# Patient Record
Sex: Female | Born: 1995
Health system: Southern US, Community
[De-identification: ages and names within clinical notes are randomized; demographics above are authoritative.]

## PROBLEM LIST (undated history)

## (undated) DIAGNOSIS — F32A Depression, unspecified: Secondary | ICD-10-CM

## (undated) DIAGNOSIS — F329 Major depressive disorder, single episode, unspecified: Secondary | ICD-10-CM

## (undated) DIAGNOSIS — O210 Mild hyperemesis gravidarum: Secondary | ICD-10-CM

## (undated) DIAGNOSIS — F319 Bipolar disorder, unspecified: Secondary | ICD-10-CM

## (undated) DIAGNOSIS — F419 Anxiety disorder, unspecified: Secondary | ICD-10-CM

## (undated) HISTORY — PX: RHINOPLASTY: SUR1284

## (undated) HISTORY — DX: Mild hyperemesis gravidarum: O21.0

---
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
## 2015-04-09 DIAGNOSIS — M419 Scoliosis, unspecified: Secondary | ICD-10-CM | POA: Insufficient documentation

## 2018-08-26 DIAGNOSIS — Z0000 Encounter for general adult medical examination without abnormal findings: Secondary | ICD-10-CM | POA: Diagnosis not present

## 2018-08-26 DIAGNOSIS — Z30432 Encounter for removal of intrauterine contraceptive device: Secondary | ICD-10-CM | POA: Diagnosis not present

## 2019-02-01 DIAGNOSIS — F603 Borderline personality disorder: Secondary | ICD-10-CM | POA: Insufficient documentation

## 2019-02-01 DIAGNOSIS — F1521 Other stimulant dependence, in remission: Secondary | ICD-10-CM | POA: Insufficient documentation

## 2019-02-01 DIAGNOSIS — F4323 Adjustment disorder with mixed anxiety and depressed mood: Secondary | ICD-10-CM | POA: Insufficient documentation

## 2019-02-01 DIAGNOSIS — F332 Major depressive disorder, recurrent severe without psychotic features: Secondary | ICD-10-CM | POA: Insufficient documentation

## 2019-02-01 DIAGNOSIS — F1220 Cannabis dependence, uncomplicated: Secondary | ICD-10-CM | POA: Insufficient documentation

## 2019-02-01 DIAGNOSIS — F4310 Post-traumatic stress disorder, unspecified: Secondary | ICD-10-CM | POA: Insufficient documentation

## 2019-02-01 DIAGNOSIS — F902 Attention-deficit hyperactivity disorder, combined type: Secondary | ICD-10-CM | POA: Insufficient documentation

## 2019-02-03 DIAGNOSIS — F17200 Nicotine dependence, unspecified, uncomplicated: Secondary | ICD-10-CM | POA: Insufficient documentation

## 2019-05-25 ENCOUNTER — Other Ambulatory Visit: Payer: Self-pay

## 2019-05-25 ENCOUNTER — Encounter (HOSPITAL_COMMUNITY): Payer: Self-pay | Admitting: Emergency Medicine

## 2019-05-25 ENCOUNTER — Emergency Department (HOSPITAL_COMMUNITY)
Admission: EM | Admit: 2019-05-25 | Discharge: 2019-05-25 | Disposition: A | Discharge status: Home / Self Care | Payer: BC Managed Care – PPO | Attending: Emergency Medicine | Admitting: Emergency Medicine

## 2019-05-25 DIAGNOSIS — K649 Unspecified hemorrhoids: Secondary | ICD-10-CM | POA: Insufficient documentation

## 2019-05-25 DIAGNOSIS — F17210 Nicotine dependence, cigarettes, uncomplicated: Secondary | ICD-10-CM | POA: Insufficient documentation

## 2019-05-25 DIAGNOSIS — K6289 Other specified diseases of anus and rectum: Secondary | ICD-10-CM | POA: Diagnosis present

## 2019-05-25 DIAGNOSIS — K648 Other hemorrhoids: Secondary | ICD-10-CM

## 2019-05-25 HISTORY — DX: Anxiety disorder, unspecified: F41.9

## 2019-05-25 HISTORY — DX: Depression, unspecified: F32.A

## 2019-05-25 HISTORY — DX: Bipolar disorder, unspecified: F31.9

## 2019-05-25 MED ORDER — LIDOCAINE VISCOUS HCL 2 % MT SOLN
15.0000 mL | Freq: Once | OROMUCOSAL | Status: AC
  Administered 2019-05-25: 15 mL via OROMUCOSAL
  Filled 2019-05-25: qty 15

## 2019-05-25 MED ORDER — HYDROCORTISONE ACETATE 25 MG RE SUPP: 25.0000 mg | Freq: Two times a day (BID) | RECTAL | 0 refills | Status: DC

## 2019-05-25 NOTE — ED Provider Notes (Signed)
[facility] EMERGENCY DEPARTMENT Provider Note   CSN: [id number] Arrival date & time: 05/25/19  1412     History   Chief Complaint Chief Complaint  Patient presents with  . Rectal Pain    HPI Casey Wolfe is a 23 y.o. female presenting with pain and swelling at her rectum with a small amount of bright red blood noted which occurred with a bowel movement she had 2 days ago.  She denies history of prior symptoms, also denies constipation or straining to defecate.  She has had no treatment for this condition prior to arrival. Describes constant aching pain, worsened with walking/palpation of the site.     The history is provided by the patient.    Past Medical History:  Diagnosis Date  . Anxiety   . Bipolar 1 disorder (HCC)   . Depression     There are no active problems to display for this patient.   Past Surgical History:  Procedure Laterality Date  . RHINOPLASTY       OB History    Gravida      Para      Term      Preterm      AB      Living  0     SAB      TAB      Ectopic      Multiple      Live Births               Home Medications    Prior to Admission medications   Medication Sig Start Date End Date Taking? Authorizing Provider  hydrocortisone (ANUSOL-HC) 25 MG suppository Place 1 suppository (25 mg total) rectally 2 (two) times daily. 05/25/19   Burgess Amor, PA-C    Family History Family History  Problem Relation Age of Onset  . COPD Other   . Congestive Heart Failure Other   . Diabetes Other     Social History Social History   Tobacco Use  . Smoking status: Current Every Day Smoker    Packs/day: 0.50    Types: Cigarettes  . Smokeless tobacco: Never Used  Substance Use Topics  . Alcohol use: Yes    Comment: 2-3 x week  . Drug use: Yes    Types: Marijuana    Comment: this morning     Allergies   Patient has no known allergies.   Review of Systems Review of Systems  Constitutional: Negative for fever.  HENT:  Negative for congestion.   Eyes: Negative.   Respiratory: Negative for chest tightness and shortness of breath.   Cardiovascular: Negative for chest pain.  Gastrointestinal: Positive for anal bleeding and rectal pain. Negative for abdominal pain, diarrhea, nausea and vomiting.  Genitourinary: Negative.   Musculoskeletal: Negative for arthralgias, joint swelling and neck pain.  Skin: Negative.  Negative for rash and wound.  Neurological: Negative for dizziness, weakness, light-headedness, numbness and headaches.  Psychiatric/Behavioral: Negative.      Physical Exam Updated Vital Signs BP 135/82 (BP Location: Right Arm)   Pulse 69   Temp 98.6 F (37 C) (Oral)   Resp 16   Ht 5\' 4"  (1.626 m)   Wt 87.1 kg   LMP 05/23/2019 (Exact Date)   SpO2 98%   BMI 32.96 kg/m   Physical Exam Vitals signs and nursing note reviewed. Exam conducted with a chaperone present.  Constitutional:      Appearance: She is well-developed.  HENT:     Head: Normocephalic and  atraumatic.  Eyes:     Conjunctiva/sclera: Conjunctivae normal.  Neck:     Musculoskeletal: Normal range of motion.  Cardiovascular:     Rate and Rhythm: Normal rate.  Pulmonary:     Effort: Pulmonary effort is normal.     Breath sounds: No wheezing.  Abdominal:     General: Bowel sounds are normal. There is no distension.     Palpations: Abdomen is soft. There is no mass.     Tenderness: There is no abdominal tenderness.  Genitourinary:    Rectum: External hemorrhoid present.     Comments: Small external hemorrhoid, soft, not thrombosed. Musculoskeletal: Normal range of motion.  Skin:    General: Skin is warm and dry.  Neurological:     Mental Status: She is alert.      ED Treatments / Results  Labs (all labs ordered are listed, but only abnormal results are displayed) Labs Reviewed - No data to display  EKG None  Radiology No results found.  Procedures Procedures (including critical care time)   Medications Ordered in ED Medications  lidocaine (XYLOCAINE) 2 % viscous mouth solution 15 mL (15 mLs Mouth/Throat Given 05/25/19 1649)     Initial Impression / Assessment and Plan / ED Course  I have reviewed the triage vital signs and the nursing notes.  Pertinent labs & imaging results that were available during my care of the patient were reviewed by me and considered in my medical decision making (see chart for details).        External hemorrhoid, discussed home tx including warm sitz baths, hydrocortisone rectal suppository prescribed, avoid constipation (pt denies).  Return precautions discussed.   Final Clinical Impressions(s) / ED Diagnoses   Final diagnoses:  Other hemorrhoids    ED Discharge Orders         Ordered    hydrocortisone (ANUSOL-HC) 25 MG suppository  2 times daily     05/25/19 1648           Landis Martins 05/25/19 1728    Davonna Belling, MD 05/25/19 2332

## 2019-05-25 NOTE — Discharge Instructions (Addendum)
Sit in a warm tub for 10-15 minutes, twice daily if possible,  followed by application of the medicine prescribed.  If you are unable to get the medicine filled, you may try Preparation H which is an over the counter version of this medicine.  Applying gentle pressure should also help to gradually reduce this hemorrhoid until it is gone.  Get rechecked for any increased pain or swelling.  Apply the lidocaine every 4 hours if needed for pain relief.

## 2019-05-25 NOTE — ED Triage Notes (Addendum)
EMS reports pt has rectal prolapse.  Reports small amount of bright red blood.  Pt says it started prolapsing Monday but has gotten worse.    EMS says pt drank 1 "bootlegger" pta.  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  EMS reports pt refused to wear a mask due to PTSD.

## 2019-05-25 NOTE — ED Triage Notes (Signed)
Patient reports a rectal prolapse, felt on Monday. Reports she has had a BM.

## 2019-07-29 NOTE — L&D Delivery Note (Addendum)
Delivery Note At 7:27 PM a viable female was delivered via Vaginal, Spontaneous (Presentation:   Occiput Anterior).  APGAR: 3, 8. NICU team called due to prolonged cyanosis, no good cry. Baby back to mom for skin-to-skin once more appropriate and cleared by NICU team; weight: pending   Placenta status: Spontaneous, Intact.  Cord: 3 vessels with the following complications: None.    Anesthesia: Epidural Episiotomy: None Lacerations: 2nd degree Suture Repair: 3.0 vicryl Est. Blood Loss (mL):  100  Mom to postpartum.  Baby to Couplet care / Skin to Skin.  Sabino Dick 03/18/2020, 8:00 PM  Patient is a 24 y.o. at [redacted]w[redacted]d who was admitted for IOL for gHTN on 8/20, significant hx of anxiety and depression.  She progressed with augmentation via FB, cytotec, AROM and pitocin.  I was gloved and present for delivery in its entirety.  Second stage of labor progressed, baby delivered after 3 hours of pushing. Infant intially with spontaneous cry and tone. At 2 minutes of life, infant cyanotic and poor effort noted. Code APGAR called and NICU to assess baby.    Complications: none  Lacerations: 2nd degree laceration  EBL: 100  Rolm Bookbinder, CNM 8:05 PM

## 2019-08-10 DIAGNOSIS — R1030 Lower abdominal pain, unspecified: Secondary | ICD-10-CM | POA: Diagnosis not present

## 2019-08-10 DIAGNOSIS — O26891 Other specified pregnancy related conditions, first trimester: Secondary | ICD-10-CM | POA: Diagnosis not present

## 2019-08-10 DIAGNOSIS — Z3A01 Less than 8 weeks gestation of pregnancy: Secondary | ICD-10-CM | POA: Diagnosis not present

## 2019-08-10 DIAGNOSIS — Z87891 Personal history of nicotine dependence: Secondary | ICD-10-CM | POA: Diagnosis not present

## 2019-08-10 DIAGNOSIS — Z79899 Other long term (current) drug therapy: Secondary | ICD-10-CM | POA: Diagnosis not present

## 2019-08-17 DIAGNOSIS — Z3490 Encounter for supervision of normal pregnancy, unspecified, unspecified trimester: Secondary | ICD-10-CM | POA: Diagnosis not present

## 2019-08-19 LAB — OB RESULTS CONSOLE HEPATITIS B SURFACE ANTIGEN: Hepatitis B Surface Ag: NEGATIVE

## 2019-08-19 LAB — HIV ANTIBODY (ROUTINE TESTING W REFLEX): HIV Screen 4th Generation wRfx: NONREACTIVE

## 2019-08-19 LAB — OB RESULTS CONSOLE RPR: RPR: NONREACTIVE

## 2019-08-19 LAB — OB RESULTS CONSOLE RUBELLA ANTIBODY, IGM: Rubella: IMMUNE

## 2019-08-30 DIAGNOSIS — R112 Nausea with vomiting, unspecified: Secondary | ICD-10-CM | POA: Diagnosis not present

## 2019-08-31 DIAGNOSIS — Z3A09 9 weeks gestation of pregnancy: Secondary | ICD-10-CM | POA: Diagnosis not present

## 2019-08-31 DIAGNOSIS — O211 Hyperemesis gravidarum with metabolic disturbance: Secondary | ICD-10-CM | POA: Diagnosis not present

## 2019-09-01 DIAGNOSIS — O211 Hyperemesis gravidarum with metabolic disturbance: Secondary | ICD-10-CM | POA: Diagnosis not present

## 2019-09-01 DIAGNOSIS — Z3A09 9 weeks gestation of pregnancy: Secondary | ICD-10-CM | POA: Diagnosis not present

## 2019-09-06 DIAGNOSIS — Z3490 Encounter for supervision of normal pregnancy, unspecified, unspecified trimester: Secondary | ICD-10-CM | POA: Diagnosis not present

## 2019-09-06 DIAGNOSIS — R112 Nausea with vomiting, unspecified: Secondary | ICD-10-CM | POA: Diagnosis not present

## 2019-09-06 DIAGNOSIS — Z3A11 11 weeks gestation of pregnancy: Secondary | ICD-10-CM | POA: Diagnosis not present

## 2019-09-08 DIAGNOSIS — R112 Nausea with vomiting, unspecified: Secondary | ICD-10-CM | POA: Diagnosis not present

## 2019-09-10 DIAGNOSIS — R112 Nausea with vomiting, unspecified: Secondary | ICD-10-CM | POA: Diagnosis not present

## 2019-09-13 DIAGNOSIS — R112 Nausea with vomiting, unspecified: Secondary | ICD-10-CM | POA: Diagnosis not present

## 2019-09-16 DIAGNOSIS — R112 Nausea with vomiting, unspecified: Secondary | ICD-10-CM | POA: Diagnosis not present

## 2019-09-20 DIAGNOSIS — Z3490 Encounter for supervision of normal pregnancy, unspecified, unspecified trimester: Secondary | ICD-10-CM | POA: Diagnosis not present

## 2019-09-20 DIAGNOSIS — R112 Nausea with vomiting, unspecified: Secondary | ICD-10-CM | POA: Diagnosis not present

## 2019-09-20 DIAGNOSIS — Z3A11 11 weeks gestation of pregnancy: Secondary | ICD-10-CM | POA: Diagnosis not present

## 2019-09-22 DIAGNOSIS — R112 Nausea with vomiting, unspecified: Secondary | ICD-10-CM | POA: Diagnosis not present

## 2019-09-26 DIAGNOSIS — R112 Nausea with vomiting, unspecified: Secondary | ICD-10-CM | POA: Diagnosis not present

## 2019-09-28 DIAGNOSIS — R112 Nausea with vomiting, unspecified: Secondary | ICD-10-CM | POA: Diagnosis not present

## 2019-10-01 DIAGNOSIS — R112 Nausea with vomiting, unspecified: Secondary | ICD-10-CM | POA: Diagnosis not present

## 2019-10-03 DIAGNOSIS — R112 Nausea with vomiting, unspecified: Secondary | ICD-10-CM | POA: Diagnosis not present

## 2019-10-18 ENCOUNTER — Encounter: Payer: Self-pay | Admitting: Advanced Practice Midwife

## 2019-10-18 ENCOUNTER — Other Ambulatory Visit: Payer: Self-pay

## 2019-10-18 ENCOUNTER — Ambulatory Visit (INDEPENDENT_AMBULATORY_CARE_PROVIDER_SITE_OTHER): Payer: BC Managed Care – PPO | Admitting: Advanced Practice Midwife

## 2019-10-18 VITALS — BP 102/63 | HR 86 | Temp 98.4°F | Ht 63.0 in | Wt 216.0 lb

## 2019-10-18 DIAGNOSIS — O99342 Other mental disorders complicating pregnancy, second trimester: Secondary | ICD-10-CM

## 2019-10-18 DIAGNOSIS — F329 Major depressive disorder, single episode, unspecified: Secondary | ICD-10-CM

## 2019-10-18 DIAGNOSIS — F32A Depression, unspecified: Secondary | ICD-10-CM

## 2019-10-18 DIAGNOSIS — O99213 Obesity complicating pregnancy, third trimester: Secondary | ICD-10-CM | POA: Insufficient documentation

## 2019-10-18 DIAGNOSIS — O210 Mild hyperemesis gravidarum: Secondary | ICD-10-CM

## 2019-10-18 DIAGNOSIS — Z3A19 19 weeks gestation of pregnancy: Secondary | ICD-10-CM

## 2019-10-18 DIAGNOSIS — Z3480 Encounter for supervision of other normal pregnancy, unspecified trimester: Secondary | ICD-10-CM

## 2019-10-18 DIAGNOSIS — O99212 Obesity complicating pregnancy, second trimester: Secondary | ICD-10-CM

## 2019-10-18 DIAGNOSIS — Z3482 Encounter for supervision of other normal pregnancy, second trimester: Secondary | ICD-10-CM

## 2019-10-18 MED ORDER — ASPIRIN EC 81 MG PO TBEC: 81.0000 mg | DELAYED_RELEASE_TABLET | Freq: Every day | ORAL | 5 refills | Status: DC

## 2019-10-18 NOTE — Addendum Note (Signed)
Addended by: Sharen Counter A on: 10/18/2019 06:05 PM   Modules accepted: Orders

## 2019-10-18 NOTE — Progress Notes (Signed)
.     Subjective:   Casey Wolfe is a 24 y.o. G3P0020 at [redacted]w[redacted]d by LMP being seen today for her first obstetrical visit.  Her obstetrical history is significant for obesity and has Supervision of other normal pregnancy, antepartum; Methamphetamine use disorder, severe, in sustained remission (HCC); MDD (major depressive disorder), recurrent severe, without psychosis (HCC); Borderline personality disorder (HCC); Adjustment disorder with mixed anxiety and depressed mood; ADHD (attention deficit hyperactivity disorder), combined type; Moderate cannabis use disorder (HCC); PTSD (post-traumatic stress disorder); Scoliosis; and Tobacco use disorder on their problem list.. Patient does intend to breast feed. Pregnancy history fully reviewed.  Patient reports feeling nervous about her pregnancy, worried she will not bond with the baby. Her boyfriend passed in August and the father of her current pregnancy she says is not her boyfriend that passed. Patient is not currently working due to dizziness and nausea and vomiting. States she constantly feels light-headed so it is hard for her to do simple tasks. States she has had IV fluids at home from infusion center in [facility], states the dizziness was improving. States her vomiting is worse at night.    WANTS TO TELL MOM GENDER NOT HER SO THEY CAN HAVE GENDER REVEAL.  HISTORY: OB History  Gravida Para Term Preterm AB Living  3 0 0 0 2 0  SAB TAB Ectopic Multiple Live Births  2 0 0 0 0    # Outcome Date GA Lbr Len/2nd Weight Sex Delivery Anes PTL Lv  3 Current           2 SAB 2015          1 SAB 2013           Past Medical History:  Diagnosis Date  . Anxiety   . Bipolar 1 disorder (HCC)   . Depression   . Hyperemesis affecting pregnancy, antepartum    Past Surgical History:  Procedure Laterality Date  . RHINOPLASTY     Family History  Problem Relation Age of Onset  . COPD Other   . Congestive Heart Failure Other   . Diabetes Other     . Anxiety disorder Mother   . Arthritis Mother   . Heart murmur Sister   . ADD / ADHD Brother   . Heart murmur Brother   . Diabetes Maternal Grandmother   . Hyperlipidemia Maternal Grandmother   . Hypertension Maternal Grandmother   . Aneurysm Maternal Grandmother    Social History   Tobacco Use  . Smoking status: Former Smoker    Packs/day: 0.50    Types: Cigarettes    Quit date: 08/18/2019    Years since quitting: 0.1  . Smokeless tobacco: Never Used  Substance Use Topics  . Alcohol use: Not Currently    Comment: 2-3 x week  . Drug use: Yes    Types: Marijuana    Comment: this morning   No Known Allergies Current Outpatient Medications on File Prior to Visit  Medication Sig Dispense Refill  . buPROPion (WELLBUTRIN XL) 300 MG 24 hr tablet Take 300 mg by mouth daily.    . Doxylamine-Pyridoxine (DICLEGIS) 10-10 MG TBEC Diclegis 10 mg-10 mg tablet,delayed release  2 po at hs, then 1 po in am and in afternoon.    . famotidine (PEPCID) 20 MG tablet Take by mouth.    . folic acid (FOLVITE) 800 MCG tablet Take 400 mcg by mouth daily.    Marland Kitchen lamoTRIgine (LAMICTAL) 100 MG tablet Take by mouth.    Marland Kitchen  meclizine (ANTIVERT) 12.5 MG tablet meclizine 12.5 mg tablet  TAKE ONE TABLET (12.5 MG DOSE) BY MOUTH 3 TIMES DAILY FOR 10 DAYS    . ondansetron (ZOFRAN ODT) 4 MG disintegrating tablet Zofran ODT 4 mg disintegrating tablet  Place 2 tablets twice a day by translingual route.    . Prenatal Vit-DSS-Fe Fum-FA (PRENATAL 19) tablet Take by mouth.    . promethazine (PHENERGAN) 25 MG tablet Take by mouth.    . sertraline (ZOLOFT) 100 MG tablet Take 150 mg by mouth daily.    . hydrocortisone (ANUSOL-HC) 25 MG suppository Place 1 suppository (25 mg total) rectally 2 (two) times daily. (Patient not taking: Reported on 10/18/2019) 12 suppository 0   No current facility-administered medications on file prior to visit.     Indications for ASA therapy (per uptodate) One of the following: Multifetal  gestation No Chronic hypertension No Type 1 or 2 diabetes mellitus No Chronic kidney disease No Autoimmune disease (antiphospholipid syndrome, systemic lupus erythematosus) No   Two or more of the following: Nulliparity yes Obesity (body mass index >30 kg/m2) yes  Family history of preeclampsia in mother or sister No Age ?35 years No Sociodemographic characteristics (African American race, low socioeconomic level) Yes  Personal risk factors (eg, previous pregnancy with low birth weight or small for gestational age infant, previous adverse pregnancy outcome [eg, stillbirth], interval >10 years between pregnancies) No   Indications for early 1 hour GTT (per uptodate)  BMI >25 (>23 in Asian women) AND one of the following  Gestational diabetes mellitus in a previous pregnancy No Glycated hemoglobin ?5.7 percent (39 mmol/mol), impaired glucose tolerance, or impaired fasting glucose on previous testing No First-degree relative with diabetes No High-risk race/ethnicity (eg, African American, Latino, Native American, [place] American, Pacific Islander) No History of cardiovascular disease No Hypertension or on therapy for hypertension No High-density lipoprotein cholesterol level <35 mg/dL (0.90 mmol/L) and/or a triglyceride level >250 mg/dL (2.82 mmol/L) No Polycystic ovary syndrome No Physical inactivity Yes Other clinical condition associated with insulin resistance (eg, severe obesity, acanthosis nigricans) No Previous birth of an infant weighing ?4000 g No Previous stillbirth of unknown cause No Exam   Vitals:   10/18/19 1417 10/18/19 1420  BP:  102/63  Pulse:  86  Temp:  98.4 F (36.9 C)  Weight:  98 kg  Height: 5\' 3"  (1.6 m)    Fetal Heart Rate (bpm): 148  Uterus:     Pelvic Exam: Perineum: no hemorrhoids, normal perineum   Vulva: normal external genitalia, no lesions   Vagina:  normal mucosa, normal discharge   Cervix: no lesions and normal, pap smear done.    Adnexa:  normal adnexa and no mass, fullness, tenderness   Bony Pelvis: average  System: General: well-developed, well-nourished female in no acute distress   Breast:  normal appearance, no masses or tenderness   Skin: normal coloration and turgor, no rashes   Neurologic: oriented, normal, negative, normal mood   Extremities: normal strength, tone, and muscle mass, ROM of all joints is normal   HEENT PERRLA, extraocular movement intact and sclera clear, anicteric   Mouth/Teeth mucous membranes moist, pharynx normal without lesions and dental hygiene good   Neck supple and no masses   Cardiovascular: regular rate and rhythm   Respiratory:  no respiratory distress, normal breath sounds   Abdomen: soft, non-tender; bowel sounds normal; no masses,  no organomegaly     Assessment:   Pregnancy: [id number] Patient Active Problem List   Diagnosis  Date Noted  . Supervision of other normal pregnancy, antepartum 10/18/2019  . Tobacco use disorder 02/03/2019  . Methamphetamine use disorder, severe, in sustained remission (HCC) 02/01/2019  . MDD (major depressive disorder), recurrent severe, without psychosis (HCC) 02/01/2019  . Borderline personality disorder (HCC) 02/01/2019  . Adjustment disorder with mixed anxiety and depressed mood 02/01/2019  . ADHD (attention deficit hyperactivity disorder), combined type 02/01/2019  . Moderate cannabis use disorder (HCC) 02/01/2019  . PTSD (post-traumatic stress disorder) 02/01/2019  . Scoliosis 04/09/2015     Plan:  1. Supervision of other normal pregnancy, antepartum --Anticipatory guidance about next visits/weeks of pregnancy given. --Next visit in 2 weeks in office (see below) - Babyscripts Schedule Optimization - Alpha fetoprotein, maternal  2. Depression complicating pregnancy, antepartum, second trimester --Pt has hx bipolar, PTSD, and depression.  She is on medications and sees counselor regularly.  --In pregnancy, she feels guilt that she miscarried  with her previous boyfriend, who died last year but then became pregnant with the current FOB.  She is worried she will not bond with the baby.  She is also worried about all testing, genetic tests, [place], etc.and worried that something is wrong with the baby. --Continue counseling and medications at this time.  3. Hyperemesis gravidarum --Pt taking Diclegis, Phenergan, Meclizine and Zofran.  Diclegis makes her sleepy but doesn't help so wants to stop the med.   --Pt had infusions 3 times per week with previous OB practice but they cancelled them when she transferred practices.   --D/C Diclegis, continue other meds. --Restart infusions, LR 1000 ml with 12.5 Phenergan three times per week --Note for pt missed work from 10/11/19 until 2 weeks from today.   --Appt in office in 2 weeks to reevaluate returning to work/progress after infusions. --Pt wants to return to work.     Initial labs drawn. Continue prenatal vitamins. Discussed and offered genetic screening options, including Quad screen/AFP, NIPS testing, and option to decline testing. Benefits/risks/alternatives reviewed. Pt aware that anatomy US is form of genetic screening with lower accuracy in detecting trisomies than blood work.  Pt chooses/declines genetic screening today. AFP: ordered Ultrasound discussed; fetal anatomic survey: ordered. Problem list reviewed and updated. The nature of [facility] with multiple MDs and other Advanced Practice Providers was explained to patient; also emphasized that residents, students are part of our team. Routine obstetric precautions reviewed. Return in about 2 weeks (around 11/01/2019).   Nicole Kindred, Student-PA 10/18/19 3:33 PM   Attestation of Supervision of Student:  I confirm that I have verified the information documented in the physician assistant student's note and that I have also personally performed the history, physical exam and all medical  decision making activities.  I have verified that all services and findings are accurately documented in this student's note; and I agree with management and plan as outlined in the documentation. I have also made any necessary editorial changes.  Sharen Counter, CNM Certified Nurse-Midwife Center for [employer], [facility] 10/18/2019 5:40 PM

## 2019-10-18 NOTE — Patient Instructions (Signed)
Considering Waterbirth? Guide for patients at [facility] [employer]  Why consider waterbirth?  . Gentle birth for babies . Less pain medicine used in labor . May allow for passive descent/less pushing . May reduce perineal tears  . More mobility and instinctive maternal position changes . Increased maternal relaxation . Reduced blood pressure in labor  Is waterbirth safe? What are the risks of infection, drowning or other complications?  . Infection: o Very low risk (3.7 % for tub vs 4.8% for bed) o 7 in 8000 waterbirths with documented infection o Poorly cleaned equipment most common cause o Slightly lower group B strep transmission rate  . Drowning o Maternal:  - Very low risk   - Related to seizures or fainting o Newborn:  - Very low risk. No evidence of increased risk of respiratory problems in multiple large studies - Physiological protection from breathing under water - Avoid underwater birth if there are any fetal complications - Once baby's head is out of the water, keep it out.  . Birth complication o Some reports of cord trauma, but risk decreased by bringing baby to surface gradually o No evidence of increased risk of shoulder dystocia. Mothers can usually change positions faster in water than in a bed, possibly aiding the maneuvers to free the shoulder.   You must attend a Doren Custard class at [facility]  3rd Wednesday of every month from 7-9pm  [employer] by calling [phone] or online at [email or web address]  Bring [place] the certificate from the class to your prenatal appointment  Meet with a midwife at 36 weeks to see if you can still plan a waterbirth and to sign the consent.   If you plan a waterbirth at [facility], you will need to purchase the following:  Fish net  Bathing suit top (optional)  Long-handled mirror (optional)  Places to purchase or rent supplies:   GotWebTools.is  for tub purchases and supplies  Waterbirthsolutions.com for tub purchases and supplies  The Labor Ladies (www.[email or web address]) $275 for tub rental/set-up & take down/kit   [employer] (http://www.fleming.com/.htm) Information regarding doulas (labor support) who provide pool rentals  Things that would prevent you from having a waterbirth:  Premature, <37wks  Previous cesarean birth  Presence of thick meconium-stained fluid  Multiple gestation (Twins, triplets, etc.)  Uncontrolled diabetes or gestational diabetes requiring medication  Hypertension requiring medication or diagnosis of pre-eclampsia  Heavy vaginal bleeding  Non-reassuring fetal heart rate  Active infection (MRSA, etc.). Group B Strep is NOT a contraindication for waterbirth.  If your labor has to be induced and induction method requires continuous monitoring of the baby's heart rate  Other risks/issues identified by your obstetrical provider  Please remember that birth is unpredictable. Under certain unforeseeable circumstances your provider may advise against giving birth in the tub. These decisions will be made on a case-by-case basis and with the safety of you and your baby as our highest priority.    DOULA LIST   Beautiful Beginnings Doula  Eek  [phone]  [email or web address] .[email or web address]  [email or web address]  Zula the Maurine Cane [phone]  [email or web address]   Company secretary, LLC   Precious Reece Leader   [email or web address]   ??THE MOTHERLY DOULA?? Lajuana Ripple   [phone]   [email or web address] .com     The Abundant Life Doula  Mattie Marlin  [phone]    Theabundantlifedoula_2 .com [email or web address]     [phone]  [email or web address]_[email or web address] .com  [email or web address]   Lenoria Farrier: [place] [phone]       Remmcmillen_1 .com  www.[email or web address]   Rand [phone]   [email or web address]   [place]  [phone]  tiffany_[email or web address] .com  AffordableShare.[email or web address]   Ease Doula Collaborative   Burney Gauze   [phone]  Easedoulas_3 .com [email or web address]  [phone] [email or web address]_4 .[email or web address] PopularFlicks.co.nz  Natural Baby Doulas  Almyra Brace         [facility]     [phone] [email or web address] .com  www.[email or web address]   [facility]   [place] [phone] Info_6 .com   Priscille Kluver Doula Services  Abbie Sons     [phone]  [email or web address] .com [email or web address]  [facility]     [phone]  soleildoulaco_8 .com  Facebook and IG _9 .Vivia Birmingham  [phone] bccooper_10 .Dola Argyle [phone] [email or web address] .[email or web address]   Delanna Ahmadi  [phone] [email or web address]     [facility]  [phone] madaboutmemories_13 .com   IG _14    Henrine Screws    [phone] cishealthnetwork_15 .com   Ellis Savage" Free  [phone] [email or web address] .Doreatha Lew Roll  [phone] Rollmtende_17 .com   Susie Williams   [email or web address] .[email or web address]    Crissie Reese    [phone] [email or web address] .[email or web address]     Carlean Jews  [phone] [email or web address] .[email or web address]    Rigoberto Noel  [phone] [email or web address] .com [email or web address]    Shayla Rhem    [phone]

## 2019-10-19 ENCOUNTER — Telehealth: Payer: Self-pay | Admitting: *Deleted

## 2019-10-19 ENCOUNTER — Encounter: Payer: Self-pay | Admitting: *Deleted

## 2019-10-19 LAB — ALPHA FETOPROTEIN, MATERNAL
AFP MoM: 0.91
AFP, Serum: 27.3 ng/mL
Calc'd Gestational Age: 17 weeks
Maternal Wt: 216 [lb_av]
Risk for ONTD: <1
Twins-AFP: 1

## 2019-10-19 NOTE — Telephone Encounter (Signed)
Attempted to call pt via phone but mailbox was full.  My chart message sent giving her the appt for her IV infusion date at [facility].  The date of 10/24/19 @ 11:00 and the address of the facility given.  Requested that pt respond back to me and verify she received the message.

## 2019-10-20 ENCOUNTER — Telehealth: Payer: Self-pay | Admitting: *Deleted

## 2019-10-20 NOTE — Telephone Encounter (Signed)
Called patient twice, not able to leave message in reference to her infusion appointment this morning due to mailbox being full.

## 2019-10-21 ENCOUNTER — Other Ambulatory Visit (HOSPITAL_COMMUNITY): Payer: Self-pay

## 2019-10-24 ENCOUNTER — Inpatient Hospital Stay (HOSPITAL_COMMUNITY): Admission: RE | Admit: 2019-10-24 | Payer: BC Managed Care – PPO | Source: Ambulatory Visit

## 2019-10-26 ENCOUNTER — Ambulatory Visit (HOSPITAL_COMMUNITY)
Admission: RE | Admit: 2019-10-26 | Discharge: 2019-10-26 | Disposition: A | Discharge status: Home / Self Care | Payer: BC Managed Care – PPO | Source: Ambulatory Visit | Attending: Advanced Practice Midwife | Admitting: Advanced Practice Midwife

## 2019-10-26 ENCOUNTER — Other Ambulatory Visit: Payer: Self-pay

## 2019-10-26 DIAGNOSIS — O210 Mild hyperemesis gravidarum: Secondary | ICD-10-CM | POA: Insufficient documentation

## 2019-10-26 DIAGNOSIS — Z3A00 Weeks of gestation of pregnancy not specified: Secondary | ICD-10-CM | POA: Insufficient documentation

## 2019-10-26 MED ORDER — PROMETHAZINE HCL 25 MG/ML IJ SOLN
12.5000 mg | Freq: Once | INTRAVENOUS | Status: DC
  Administered 2019-10-26: 12.5 mg via INTRAVENOUS
  Filled 2019-10-26: qty 0.5

## 2019-10-27 ENCOUNTER — Encounter (HOSPITAL_COMMUNITY): Payer: BC Managed Care – PPO

## 2019-10-28 ENCOUNTER — Inpatient Hospital Stay (HOSPITAL_COMMUNITY): Admission: RE | Admit: 2019-10-28 | Payer: BC Managed Care – PPO | Source: Ambulatory Visit

## 2019-10-31 ENCOUNTER — Encounter (HOSPITAL_COMMUNITY): Payer: BC Managed Care – PPO

## 2019-11-01 ENCOUNTER — Ambulatory Visit (INDEPENDENT_AMBULATORY_CARE_PROVIDER_SITE_OTHER): Payer: BC Managed Care – PPO | Admitting: Advanced Practice Midwife

## 2019-11-01 ENCOUNTER — Other Ambulatory Visit: Payer: Self-pay

## 2019-11-01 VITALS — BP 111/72 | HR 86 | Temp 98.3°F | Wt 216.0 lb

## 2019-11-01 DIAGNOSIS — Z3480 Encounter for supervision of other normal pregnancy, unspecified trimester: Secondary | ICD-10-CM

## 2019-11-01 DIAGNOSIS — F419 Anxiety disorder, unspecified: Secondary | ICD-10-CM

## 2019-11-01 DIAGNOSIS — O99342 Other mental disorders complicating pregnancy, second trimester: Secondary | ICD-10-CM

## 2019-11-01 DIAGNOSIS — O210 Mild hyperemesis gravidarum: Secondary | ICD-10-CM

## 2019-11-01 MED ORDER — BUSPIRONE HCL 5 MG PO TABS: 5.0000 mg | ORAL_TABLET | Freq: Three times a day (TID) | ORAL | 5 refills | Status: DC

## 2019-11-01 NOTE — Progress Notes (Signed)
Pt c/o increased anxiety- issues with FOB and work
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
## 2019-11-02 ENCOUNTER — Ambulatory Visit (HOSPITAL_COMMUNITY)
Admission: RE | Admit: 2019-11-02 | Discharge: 2019-11-02 | Disposition: A | Discharge status: Home / Self Care | Payer: BC Managed Care – PPO | Source: Ambulatory Visit | Attending: Advanced Practice Midwife | Admitting: Advanced Practice Midwife

## 2019-11-02 ENCOUNTER — Other Ambulatory Visit: Payer: Self-pay | Admitting: Advanced Practice Midwife

## 2019-11-02 ENCOUNTER — Other Ambulatory Visit (HOSPITAL_COMMUNITY): Payer: Self-pay | Admitting: *Deleted

## 2019-11-02 DIAGNOSIS — Z363 Encounter for antenatal screening for malformations: Secondary | ICD-10-CM | POA: Diagnosis not present

## 2019-11-02 DIAGNOSIS — Z3480 Encounter for supervision of other normal pregnancy, unspecified trimester: Secondary | ICD-10-CM | POA: Diagnosis not present

## 2019-11-02 DIAGNOSIS — O99342 Other mental disorders complicating pregnancy, second trimester: Secondary | ICD-10-CM | POA: Diagnosis not present

## 2019-11-02 DIAGNOSIS — O99332 Smoking (tobacco) complicating pregnancy, second trimester: Secondary | ICD-10-CM | POA: Diagnosis not present

## 2019-11-02 DIAGNOSIS — O99212 Obesity complicating pregnancy, second trimester: Secondary | ICD-10-CM | POA: Diagnosis not present

## 2019-11-02 DIAGNOSIS — Z3A19 19 weeks gestation of pregnancy: Secondary | ICD-10-CM

## 2019-11-02 DIAGNOSIS — O99210 Obesity complicating pregnancy, unspecified trimester: Secondary | ICD-10-CM

## 2019-11-11 ENCOUNTER — Other Ambulatory Visit: Payer: Self-pay

## 2019-11-11 ENCOUNTER — Ambulatory Visit (HOSPITAL_COMMUNITY)
Admission: RE | Admit: 2019-11-11 | Discharge: 2019-11-11 | Disposition: A | Discharge status: Home / Self Care | Payer: BC Managed Care – PPO | Source: Ambulatory Visit | Attending: Advanced Practice Midwife | Admitting: Advanced Practice Midwife

## 2019-11-11 DIAGNOSIS — Z3A00 Weeks of gestation of pregnancy not specified: Secondary | ICD-10-CM | POA: Insufficient documentation

## 2019-11-11 DIAGNOSIS — O210 Mild hyperemesis gravidarum: Secondary | ICD-10-CM | POA: Diagnosis present

## 2019-11-11 MED ORDER — PROMETHAZINE HCL 25 MG/ML IJ SOLN
12.5000 mg | Freq: Once | INTRAVENOUS | Status: AC
  Administered 2019-11-11: 12.5 mg via INTRAVENOUS
  Filled 2019-11-11: qty 0.5

## 2019-11-14 ENCOUNTER — Encounter (HOSPITAL_COMMUNITY)
Admission: RE | Admit: 2019-11-14 | Discharge: 2019-11-14 | Disposition: A | Discharge status: Home / Self Care | Payer: BC Managed Care – PPO | Source: Ambulatory Visit | Attending: Advanced Practice Midwife | Admitting: Advanced Practice Midwife

## 2019-11-14 ENCOUNTER — Other Ambulatory Visit: Payer: Self-pay

## 2019-11-14 DIAGNOSIS — Z20822 Contact with and (suspected) exposure to covid-19: Secondary | ICD-10-CM | POA: Diagnosis not present

## 2019-11-14 DIAGNOSIS — O99342 Other mental disorders complicating pregnancy, second trimester: Secondary | ICD-10-CM | POA: Diagnosis not present

## 2019-11-14 DIAGNOSIS — F419 Anxiety disorder, unspecified: Secondary | ICD-10-CM | POA: Diagnosis not present

## 2019-11-14 DIAGNOSIS — O99012 Anemia complicating pregnancy, second trimester: Secondary | ICD-10-CM | POA: Insufficient documentation

## 2019-11-14 DIAGNOSIS — O210 Mild hyperemesis gravidarum: Secondary | ICD-10-CM | POA: Insufficient documentation

## 2019-11-14 DIAGNOSIS — Z3A21 21 weeks gestation of pregnancy: Secondary | ICD-10-CM | POA: Insufficient documentation

## 2019-11-14 MED ORDER — PROMETHAZINE HCL 25 MG/ML IJ SOLN
12.5000 mg | INTRAVENOUS | Status: DC
  Administered 2019-11-14: 10:00:00 12.5 mg via INTRAVENOUS
  Filled 2019-11-14: qty 0.5

## 2019-11-15 ENCOUNTER — Telehealth (INDEPENDENT_AMBULATORY_CARE_PROVIDER_SITE_OTHER): Payer: BC Managed Care – PPO | Admitting: Advanced Practice Midwife

## 2019-11-15 DIAGNOSIS — O99342 Other mental disorders complicating pregnancy, second trimester: Secondary | ICD-10-CM

## 2019-11-15 DIAGNOSIS — Z20822 Contact with and (suspected) exposure to covid-19: Secondary | ICD-10-CM

## 2019-11-15 DIAGNOSIS — F419 Anxiety disorder, unspecified: Secondary | ICD-10-CM

## 2019-11-15 DIAGNOSIS — Z3480 Encounter for supervision of other normal pregnancy, unspecified trimester: Secondary | ICD-10-CM

## 2019-11-15 DIAGNOSIS — O210 Mild hyperemesis gravidarum: Secondary | ICD-10-CM

## 2019-11-15 NOTE — Progress Notes (Signed)
Pt called and said her sister tested positive for covid and she was around her all weekend so she went and got tested but her result isnt back yet.  She also stated she can " taste some things but not all things". I cancelled her appt with [place] on Wednesday 4/21 and rescheduled her for Thursday at noon.  I instructed her that if her result wasn't back or she was positive on Thursday morning to call and cancel with [place] and we can go from there, but she couldn't come for her appointments with [place] until her test is back and negative.  Pt verbalized understanding.

## 2019-11-15 NOTE — Progress Notes (Signed)
OBSTETRICS PRENATAL VIRTUAL VISIT ENCOUNTER NOTE  Provider location: [facility] [place]   I connected with Casey Wolfe on 11/15/19 at 10:35 AM EDT by MyChart Video Encounter at home and verified that I am speaking with the correct person using two identifiers.   I discussed the limitations, risks, security and privacy concerns of performing an evaluation and management service virtually and the availability of in person appointments. I also discussed with the patient that there may be a patient responsible charge related to this service. The patient expressed understanding and agreed to proceed. Subjective:  Casey Wolfe is a 24 y.o. G3P0020 at [redacted]w[redacted]d being seen today for ongoing prenatal care.  She is currently monitored for the following issues for this high-risk pregnancy and has Supervision of other normal pregnancy, antepartum; Methamphetamine use disorder, severe, in sustained remission ([place] [facility]); MDD (major depressive disorder), recurrent severe, without psychosis ([place]); Borderline personality disorder ([place]); Adjustment disorder with mixed anxiety and depressed mood; ADHD (attention deficit hyperactivity disorder), combined type; Moderate cannabis use disorder (HCC); PTSD (post-traumatic stress disorder); Scoliosis; Tobacco use disorder; and Obesity affecting pregnancy in second trimester on their problem list.  Patient reports sinus pressure, changes in taste.  Contractions: Not present. Vag. Bleeding: None.  Movement: Present. Denies any leaking of fluid.   The following portions of the patient's history were reviewed and updated as appropriate: allergies, current medications, past family history, past medical history, past social history, past surgical history and problem list.   Objective:  There were no vitals filed for this visit.  Fetal Status:     Movement: Present     General:  Alert, oriented and cooperative. Patient is in no acute distress.  Respiratory:  Normal respiratory effort, no problems with respiration noted  Mental Status: Normal mood and affect. Normal behavior. Normal judgment and thought content.  Rest of physical exam deferred due to type of encounter  Imaging: [place] MFM OB DETAIL +14 WK  Result Date: 11/02/2019 ----------------------------------------------------------------------  OBSTETRICS REPORT                       (Signed Final 11/02/2019 12:54 pm) ---------------------------------------------------------------------- Patient Info  ID #:       [id number]                          D.O.B.:  September 22, 1995 (23 yrs)  Name:       Casey Wolfe                      Visit Date: 11/02/2019 10:44 am ---------------------------------------------------------------------- Performed By  Performed By:     Jeanene Erb BS,      Secondary Phy.:   [facility]                    RDMS  Attending:        Sander Nephew      Address:          [address] [place]                    MD  Kathryne Sharper, [place]  Referred By:      Curtis Sites-       Location[facility]                    KIRBY CNM                                Fetal Care  Ref. Address:     [address]                    [address]                    Jacky Kindle ---------------------------------------------------------------------- Orders   #  Description                          Code         Ordered By   1  [place] MFM OB DETAIL +14 WK              [id number]     Dennys Traughber LEFTWICH-                                                        Craige Cotta  ----------------------------------------------------------------------   #  Order #                    Accession #                 Episode #   1  [id number]                  [id number]                  [id number]  ---------------------------------------------------------------------- Indications   Encounter for antenatal screening for          Z36.3   malformations   Obesity complicating pregnancy, second          O99.212   trimester   Tobacco use complicating pregnancy,            O99.332   second trimester   Other mental disorder complicating             O99.340   pregnancy, second trimester   [redacted] weeks gestation of pregnancy                Z3A.19  ---------------------------------------------------------------------- Vital Signs  Weight (lb): 216                               Height:        5'3"  BMI:         38.26 ---------------------------------------------------------------------- Fetal Evaluation  Num Of Fetuses:         1  Fetal Heart Rate(bpm):  157  Cardiac Activity:       Observed  Presentation:           Variable  Placenta:               Posterior  P. Cord Insertion:      Not well visualized  Amniotic Fluid  AFI  FV:      Within normal limits                              Largest Pocket(cm)                              5.3 ---------------------------------------------------------------------- Biometry  BPD:      42.9  mm     G. Age:  19w 0d         43  %    CI:        71.77   %    70 - 86                                                          FL/HC:      17.1   %    16.1 - 18.3  HC:      161.2  mm     G. Age:  18w 6d         32  %    HC/AC:      1.13        1.09 - 1.39  AC:      142.3  mm     G. Age:  19w 4d         61  %    FL/BPD:     64.3   %  FL:       27.6  mm     G. Age:  18w 3d         19  %    FL/AC:      19.4   %    20 - 24  HUM:      26.2  mm     G. Age:  18w 1d         27  %  NFT:         2  mm  Est. FW:     270  gm    0 lb 10 oz      39  % ---------------------------------------------------------------------- OB History  Gravidity:    3         Term:   0        Prem:   0        SAB:   2  TOP:          0       Ectopic:  0        Living: 0 ---------------------------------------------------------------------- Gestational Age  LMP:           19w 1d        Date:  06/21/19                 EDD:   03/27/20  U/S Today:     19w 0d                                        EDD:   03/28/20  Best:          19w  1d     Det. By:  LMP  (  06/21/19)          EDD:   03/27/20 ---------------------------------------------------------------------- Anatomy  Cranium:               Appears normal         LVOT:                   Appears normal  Cavum:                 Appears normal         Aortic Arch:            Appears normal  Ventricles:            Appears normal         Ductal Arch:            Not well visualized  Choroid Plexus:        Appears normal         Diaphragm:              Appears normal  Cerebellum:            Appears normal         Stomach:                Appears normal, left                                                                        sided  Posterior Fossa:       Appears normal         Abdomen:                Appears normal  Nuchal Fold:           Appears normal         Abdominal Wall:         Appears nml (cord                                                                        insert, abd wall)  Face:                  Appears normal         Cord Vessels:           Appears normal (3                         (orbits and profile)                           vessel cord)  Lips:                  Appears normal         Kidneys:                Appear normal  Palate:  Not well visualized    Bladder:                Appears normal  Thoracic:              Appears normal         Spine:                  Appears normal  Heart:                 Appears normal         Upper Extremities:      Appears normal                         (4CH, axis, and                         situs)  RVOT:                  Not well visualized    Lower Extremities:      Appears normal  Other:  Heels and 5th digit visualized. Technically difficult due to maternal          habitus and fetal position. ---------------------------------------------------------------------- Cervix Uterus Adnexa  Cervix  Length:            3.4  cm.  Normal appearance by transabdominal scan.  Adnexa  No abnormality visualized.  ---------------------------------------------------------------------- Impression  Normal anatomy, however, suboptimal views of the fetal  anatomy was obtained secondary to fetal position.  Good fetal movement and amniotic fluid  History of tobacco.  Dating is consistent with today's ultrasound. ---------------------------------------------------------------------- Recommendations  Follow up growth in 4 weeks to complete the fetal anatomy. ----------------------------------------------------------------------               Lin Landsman, MD Electronically Signed Final Report   11/02/2019 12:54 pm ----------------------------------------------------------------------   Assessment and Plan:  Pregnancy: G3P0020 at [redacted]w[redacted]d 1. Supervision of other normal pregnancy, antepartum --Anticipatory guidance about next visits/weeks of pregnancy given. --Next visit in 4 weeks in the office, pt with anxiety, prefers in office visits to hear FHT  2. Anxiety during pregnancy in second trimester, antepartum --Doing better with Buspar prescribed at last visit  3. Hyperemesis gravidarum --Doing better with infusions, had transportation issues but has made last 2 appointments and feels better.  Plan was to return to work today but is now waiting on COVID test results.   --Ok to return to work per work policy related to [employer] testing --Continue IV infusions three times weekly for another 4 weeks  4. Contact with and (suspected) exposure to covid-19 --Pt sister has COVID. Pt reports she is not sure if she is having symptoms or not because she has some sinus pressure in pregnancy anyway. She reports low grade fever, 99-100.1 for last 2-3 days but this may be from dehydration with her hyperemesis.  No cough or sore throat.  She reports she can taste some things normally but other things not as well.   --COVID test today with results pending --Reviewed COVID s/sx, reasons to seek care, COVID in pregnancy with  pt  Preterm labor symptoms and general obstetric precautions including but not limited to vaginal bleeding, contractions, leaking of fluid and fetal movement were reviewed in detail with the patient. I discussed the assessment and treatment plan with the patient. The patient was provided an opportunity to ask questions and all were answered.  The patient agreed with the plan and demonstrated an understanding of the instructions. The patient was advised to call back or seek an in-person office evaluation/go to [facility] at [facility] for any urgent or concerning symptoms. Please refer to After Visit Summary for other counseling recommendations.   I provided 10 minutes of face-to-face time during this encounter.  Return in about 4 weeks (around 12/13/2019).  Future Appointments  Date Time Provider Department Center  11/16/2019  8:00 AM [facility] ROOM 2 [facility] None  11/18/2019  8:00 AM [facility] ROOM 3 [facility] None  11/30/2019  1:45 PM [facility] [place] 2 [facility]-MFCUS MFC-US    Sharen Counter, CNM [facility] [employer], [facility]

## 2019-11-16 ENCOUNTER — Encounter (HOSPITAL_COMMUNITY): Payer: BC Managed Care – PPO

## 2019-11-17 ENCOUNTER — Inpatient Hospital Stay (HOSPITAL_COMMUNITY): Admission: RE | Admit: 2019-11-17 | Payer: BC Managed Care – PPO | Source: Ambulatory Visit

## 2019-11-18 ENCOUNTER — Encounter (HOSPITAL_COMMUNITY): Payer: BC Managed Care – PPO

## 2019-11-28 ENCOUNTER — Other Ambulatory Visit (HOSPITAL_COMMUNITY): Payer: Self-pay

## 2019-11-29 ENCOUNTER — Other Ambulatory Visit: Payer: Self-pay

## 2019-11-29 ENCOUNTER — Encounter (HOSPITAL_COMMUNITY)
Admission: RE | Admit: 2019-11-29 | Discharge: 2019-11-29 | Disposition: A | Discharge status: Home / Self Care | Payer: Medicaid Other | Source: Ambulatory Visit | Attending: Advanced Practice Midwife | Admitting: Advanced Practice Midwife

## 2019-11-29 DIAGNOSIS — F419 Anxiety disorder, unspecified: Secondary | ICD-10-CM | POA: Insufficient documentation

## 2019-11-29 DIAGNOSIS — O210 Mild hyperemesis gravidarum: Secondary | ICD-10-CM | POA: Diagnosis not present

## 2019-11-29 DIAGNOSIS — Z20822 Contact with and (suspected) exposure to covid-19: Secondary | ICD-10-CM | POA: Diagnosis not present

## 2019-11-29 DIAGNOSIS — O99342 Other mental disorders complicating pregnancy, second trimester: Secondary | ICD-10-CM | POA: Diagnosis not present

## 2019-11-29 DIAGNOSIS — O99012 Anemia complicating pregnancy, second trimester: Secondary | ICD-10-CM | POA: Diagnosis not present

## 2019-11-29 DIAGNOSIS — Z3A21 21 weeks gestation of pregnancy: Secondary | ICD-10-CM | POA: Insufficient documentation

## 2019-11-29 MED ORDER — PROMETHAZINE HCL 25 MG/ML IJ SOLN
12.5000 mg | INTRAVENOUS | Status: DC
  Administered 2019-11-29: 12.5 mg via INTRAVENOUS
  Filled 2019-11-29: qty 0.5

## 2019-11-30 ENCOUNTER — Other Ambulatory Visit: Payer: Self-pay

## 2019-11-30 ENCOUNTER — Ambulatory Visit (HOSPITAL_COMMUNITY): Payer: BC Managed Care – PPO | Attending: Obstetrics and Gynecology

## 2019-11-30 ENCOUNTER — Other Ambulatory Visit: Payer: Self-pay | Admitting: *Deleted

## 2019-11-30 ENCOUNTER — Encounter (HOSPITAL_COMMUNITY): Payer: Medicaid Other

## 2019-11-30 DIAGNOSIS — O99210 Obesity complicating pregnancy, unspecified trimester: Secondary | ICD-10-CM | POA: Insufficient documentation

## 2019-11-30 DIAGNOSIS — O99342 Other mental disorders complicating pregnancy, second trimester: Secondary | ICD-10-CM | POA: Diagnosis not present

## 2019-11-30 DIAGNOSIS — E669 Obesity, unspecified: Secondary | ICD-10-CM | POA: Diagnosis not present

## 2019-11-30 DIAGNOSIS — Z362 Encounter for other antenatal screening follow-up: Secondary | ICD-10-CM

## 2019-11-30 DIAGNOSIS — O99332 Smoking (tobacco) complicating pregnancy, second trimester: Secondary | ICD-10-CM

## 2019-11-30 DIAGNOSIS — Z720 Tobacco use: Secondary | ICD-10-CM

## 2019-11-30 DIAGNOSIS — O99212 Obesity complicating pregnancy, second trimester: Secondary | ICD-10-CM

## 2019-11-30 DIAGNOSIS — Z3A23 23 weeks gestation of pregnancy: Secondary | ICD-10-CM | POA: Diagnosis not present

## 2019-11-30 DIAGNOSIS — O321XX0 Maternal care for breech presentation, not applicable or unspecified: Secondary | ICD-10-CM | POA: Diagnosis not present

## 2019-12-02 ENCOUNTER — Other Ambulatory Visit: Payer: Self-pay

## 2019-12-02 ENCOUNTER — Encounter (HOSPITAL_COMMUNITY)
Admission: RE | Admit: 2019-12-02 | Discharge: 2019-12-02 | Disposition: A | Discharge status: Home / Self Care | Payer: Medicaid Other | Source: Ambulatory Visit | Attending: Advanced Practice Midwife | Admitting: Advanced Practice Midwife

## 2019-12-02 DIAGNOSIS — O99342 Other mental disorders complicating pregnancy, second trimester: Secondary | ICD-10-CM | POA: Diagnosis not present

## 2019-12-02 DIAGNOSIS — F419 Anxiety disorder, unspecified: Secondary | ICD-10-CM | POA: Diagnosis not present

## 2019-12-02 DIAGNOSIS — Z3A21 21 weeks gestation of pregnancy: Secondary | ICD-10-CM | POA: Diagnosis not present

## 2019-12-02 DIAGNOSIS — O210 Mild hyperemesis gravidarum: Secondary | ICD-10-CM | POA: Diagnosis not present

## 2019-12-02 DIAGNOSIS — O99012 Anemia complicating pregnancy, second trimester: Secondary | ICD-10-CM | POA: Diagnosis not present

## 2019-12-02 DIAGNOSIS — Z20822 Contact with and (suspected) exposure to covid-19: Secondary | ICD-10-CM | POA: Diagnosis not present

## 2019-12-02 MED ORDER — PROMETHAZINE HCL 25 MG/ML IJ SOLN
12.5000 mg | INTRAVENOUS | Status: DC
  Administered 2019-12-02: 12.5 mg via INTRAVENOUS
  Filled 2019-12-02: qty 0.5

## 2019-12-05 ENCOUNTER — Ambulatory Visit (HOSPITAL_COMMUNITY)
Admission: RE | Admit: 2019-12-05 | Discharge: 2019-12-05 | Disposition: A | Discharge status: Home / Self Care | Payer: Medicaid Other | Source: Ambulatory Visit | Attending: Advanced Practice Midwife | Admitting: Advanced Practice Midwife

## 2019-12-05 ENCOUNTER — Other Ambulatory Visit: Payer: Self-pay

## 2019-12-05 DIAGNOSIS — Z3A00 Weeks of gestation of pregnancy not specified: Secondary | ICD-10-CM | POA: Diagnosis not present

## 2019-12-05 DIAGNOSIS — O210 Mild hyperemesis gravidarum: Secondary | ICD-10-CM | POA: Diagnosis not present

## 2019-12-05 MED ORDER — PROMETHAZINE HCL 25 MG/ML IJ SOLN
12.5000 mg | INTRAVENOUS | Status: DC
  Administered 2019-12-05: 12.5 mg via INTRAVENOUS
  Filled 2019-12-05: qty 0.5

## 2019-12-07 ENCOUNTER — Encounter (HOSPITAL_COMMUNITY)
Admission: RE | Admit: 2019-12-07 | Discharge: 2019-12-07 | Disposition: A | Discharge status: Home / Self Care | Payer: Medicaid Other | Source: Ambulatory Visit | Attending: Advanced Practice Midwife | Admitting: Advanced Practice Midwife

## 2019-12-07 ENCOUNTER — Other Ambulatory Visit: Payer: Self-pay

## 2019-12-07 DIAGNOSIS — O99012 Anemia complicating pregnancy, second trimester: Secondary | ICD-10-CM | POA: Diagnosis not present

## 2019-12-07 DIAGNOSIS — Z3A21 21 weeks gestation of pregnancy: Secondary | ICD-10-CM | POA: Diagnosis not present

## 2019-12-07 DIAGNOSIS — O210 Mild hyperemesis gravidarum: Secondary | ICD-10-CM | POA: Diagnosis not present

## 2019-12-07 DIAGNOSIS — Z20822 Contact with and (suspected) exposure to covid-19: Secondary | ICD-10-CM | POA: Diagnosis not present

## 2019-12-07 DIAGNOSIS — O99342 Other mental disorders complicating pregnancy, second trimester: Secondary | ICD-10-CM | POA: Diagnosis not present

## 2019-12-07 DIAGNOSIS — F419 Anxiety disorder, unspecified: Secondary | ICD-10-CM | POA: Diagnosis not present

## 2019-12-07 MED ORDER — PROMETHAZINE HCL 25 MG/ML IJ SOLN
12.5000 mg | INTRAVENOUS | Status: DC
  Administered 2019-12-07: 12.5 mg via INTRAVENOUS
  Filled 2019-12-07: qty 0.5

## 2019-12-09 ENCOUNTER — Encounter (HOSPITAL_COMMUNITY)
Admission: RE | Admit: 2019-12-09 | Discharge: 2019-12-09 | Disposition: A | Discharge status: Home / Self Care | Payer: Medicaid Other | Source: Ambulatory Visit | Attending: Advanced Practice Midwife | Admitting: Advanced Practice Midwife

## 2019-12-09 ENCOUNTER — Other Ambulatory Visit: Payer: Self-pay

## 2019-12-09 DIAGNOSIS — Z3A00 Weeks of gestation of pregnancy not specified: Secondary | ICD-10-CM | POA: Insufficient documentation

## 2019-12-09 DIAGNOSIS — O210 Mild hyperemesis gravidarum: Secondary | ICD-10-CM | POA: Diagnosis not present

## 2019-12-09 MED ORDER — PROMETHAZINE HCL 25 MG/ML IJ SOLN
12.5000 mg | INTRAVENOUS | Status: DC
  Administered 2019-12-09: 12.5 mg via INTRAVENOUS
  Filled 2019-12-09: qty 0.5

## 2019-12-12 ENCOUNTER — Other Ambulatory Visit: Payer: Self-pay

## 2019-12-12 ENCOUNTER — Encounter (HOSPITAL_COMMUNITY)
Admission: RE | Admit: 2019-12-12 | Discharge: 2019-12-12 | Disposition: A | Discharge status: Home / Self Care | Payer: Medicaid Other | Source: Ambulatory Visit | Attending: Advanced Practice Midwife | Admitting: Advanced Practice Midwife

## 2019-12-12 DIAGNOSIS — O99012 Anemia complicating pregnancy, second trimester: Secondary | ICD-10-CM | POA: Diagnosis not present

## 2019-12-12 DIAGNOSIS — Z20822 Contact with and (suspected) exposure to covid-19: Secondary | ICD-10-CM | POA: Diagnosis not present

## 2019-12-12 DIAGNOSIS — F419 Anxiety disorder, unspecified: Secondary | ICD-10-CM | POA: Diagnosis not present

## 2019-12-12 DIAGNOSIS — O210 Mild hyperemesis gravidarum: Secondary | ICD-10-CM | POA: Diagnosis not present

## 2019-12-12 DIAGNOSIS — O99342 Other mental disorders complicating pregnancy, second trimester: Secondary | ICD-10-CM | POA: Diagnosis not present

## 2019-12-12 DIAGNOSIS — Z3A21 21 weeks gestation of pregnancy: Secondary | ICD-10-CM | POA: Diagnosis not present

## 2019-12-12 MED ORDER — PROMETHAZINE HCL 25 MG/ML IJ SOLN
12.5000 mg | INTRAVENOUS | Status: DC
  Administered 2019-12-12: 12.5 mg via INTRAVENOUS
  Filled 2019-12-12: qty 0.5

## 2019-12-13 ENCOUNTER — Ambulatory Visit (INDEPENDENT_AMBULATORY_CARE_PROVIDER_SITE_OTHER): Payer: Medicaid Other | Admitting: Advanced Practice Midwife

## 2019-12-13 ENCOUNTER — Other Ambulatory Visit: Payer: Self-pay

## 2019-12-13 VITALS — BP 106/68 | HR 84 | Wt 224.0 lb

## 2019-12-13 DIAGNOSIS — O99342 Other mental disorders complicating pregnancy, second trimester: Secondary | ICD-10-CM

## 2019-12-13 DIAGNOSIS — F419 Anxiety disorder, unspecified: Secondary | ICD-10-CM

## 2019-12-13 DIAGNOSIS — O26842 Uterine size-date discrepancy, second trimester: Secondary | ICD-10-CM

## 2019-12-13 DIAGNOSIS — O210 Mild hyperemesis gravidarum: Secondary | ICD-10-CM

## 2019-12-13 DIAGNOSIS — Z3480 Encounter for supervision of other normal pregnancy, unspecified trimester: Secondary | ICD-10-CM
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
## 2019-12-14 ENCOUNTER — Inpatient Hospital Stay (HOSPITAL_COMMUNITY): Admission: RE | Admit: 2019-12-14 | Payer: BC Managed Care – PPO | Source: Ambulatory Visit

## 2019-12-16 ENCOUNTER — Other Ambulatory Visit: Payer: Self-pay

## 2019-12-16 ENCOUNTER — Encounter (HOSPITAL_COMMUNITY)
Admission: RE | Admit: 2019-12-16 | Discharge: 2019-12-16 | Disposition: A | Discharge status: Home / Self Care | Payer: Medicaid Other | Source: Ambulatory Visit | Attending: Advanced Practice Midwife | Admitting: Advanced Practice Midwife

## 2019-12-16 DIAGNOSIS — Z20822 Contact with and (suspected) exposure to covid-19: Secondary | ICD-10-CM | POA: Diagnosis not present

## 2019-12-16 DIAGNOSIS — O99342 Other mental disorders complicating pregnancy, second trimester: Secondary | ICD-10-CM | POA: Diagnosis not present

## 2019-12-16 DIAGNOSIS — Z3A21 21 weeks gestation of pregnancy: Secondary | ICD-10-CM | POA: Diagnosis not present

## 2019-12-16 DIAGNOSIS — O210 Mild hyperemesis gravidarum: Secondary | ICD-10-CM | POA: Diagnosis not present

## 2019-12-16 DIAGNOSIS — O99012 Anemia complicating pregnancy, second trimester: Secondary | ICD-10-CM | POA: Diagnosis not present

## 2019-12-16 DIAGNOSIS — F419 Anxiety disorder, unspecified: Secondary | ICD-10-CM | POA: Diagnosis not present

## 2019-12-16 MED ORDER — PROMETHAZINE HCL 25 MG/ML IJ SOLN
12.5000 mg | INTRAVENOUS | Status: DC
  Administered 2019-12-16: 12.5 mg via INTRAVENOUS
  Filled 2019-12-16: qty 0.5

## 2019-12-19 ENCOUNTER — Encounter (HOSPITAL_COMMUNITY)
Admission: RE | Admit: 2019-12-19 | Discharge: 2019-12-19 | Disposition: A | Discharge status: Home / Self Care | Payer: Medicaid Other | Source: Ambulatory Visit | Attending: Advanced Practice Midwife | Admitting: Advanced Practice Midwife

## 2019-12-19 ENCOUNTER — Other Ambulatory Visit: Payer: Self-pay

## 2019-12-19 DIAGNOSIS — Z20822 Contact with and (suspected) exposure to covid-19: Secondary | ICD-10-CM | POA: Diagnosis not present

## 2019-12-19 DIAGNOSIS — O99342 Other mental disorders complicating pregnancy, second trimester: Secondary | ICD-10-CM | POA: Diagnosis not present

## 2019-12-19 DIAGNOSIS — O210 Mild hyperemesis gravidarum: Secondary | ICD-10-CM | POA: Diagnosis not present

## 2019-12-19 DIAGNOSIS — Z3A21 21 weeks gestation of pregnancy: Secondary | ICD-10-CM | POA: Diagnosis not present

## 2019-12-19 DIAGNOSIS — O99012 Anemia complicating pregnancy, second trimester: Secondary | ICD-10-CM | POA: Diagnosis not present

## 2019-12-19 DIAGNOSIS — F419 Anxiety disorder, unspecified: Secondary | ICD-10-CM | POA: Diagnosis not present

## 2019-12-19 MED ORDER — PROMETHAZINE HCL 25 MG/ML IJ SOLN
12.5000 mg | INTRAVENOUS | Status: DC
  Administered 2019-12-19: 12.5 mg via INTRAVENOUS
  Filled 2019-12-19: qty 0.5

## 2019-12-22 ENCOUNTER — Encounter (HOSPITAL_COMMUNITY)
Admission: RE | Admit: 2019-12-22 | Discharge: 2019-12-22 | Disposition: A | Discharge status: Home / Self Care | Payer: Medicaid Other | Source: Ambulatory Visit | Attending: Advanced Practice Midwife | Admitting: Advanced Practice Midwife

## 2019-12-22 DIAGNOSIS — Z3A21 21 weeks gestation of pregnancy: Secondary | ICD-10-CM | POA: Diagnosis not present

## 2019-12-22 DIAGNOSIS — O210 Mild hyperemesis gravidarum: Secondary | ICD-10-CM | POA: Diagnosis not present

## 2019-12-22 DIAGNOSIS — O99342 Other mental disorders complicating pregnancy, second trimester: Secondary | ICD-10-CM | POA: Diagnosis not present

## 2019-12-22 DIAGNOSIS — Z20822 Contact with and (suspected) exposure to covid-19: Secondary | ICD-10-CM | POA: Diagnosis not present

## 2019-12-22 DIAGNOSIS — F419 Anxiety disorder, unspecified: Secondary | ICD-10-CM | POA: Diagnosis not present

## 2019-12-22 DIAGNOSIS — O99012 Anemia complicating pregnancy, second trimester: Secondary | ICD-10-CM | POA: Diagnosis not present

## 2019-12-22 MED ORDER — PROMETHAZINE HCL 25 MG/ML IJ SOLN
12.5000 mg | INTRAVENOUS | Status: DC
  Administered 2019-12-22: 12.5 mg via INTRAVENOUS
  Filled 2019-12-22: qty 0.5

## 2019-12-28 ENCOUNTER — Inpatient Hospital Stay (HOSPITAL_COMMUNITY)
Admission: AD | Admit: 2019-12-28 | Discharge: 2019-12-28 | Disposition: A | Discharge status: Home / Self Care | Payer: Medicaid Other | Attending: Obstetrics and Gynecology | Admitting: Obstetrics and Gynecology

## 2019-12-28 ENCOUNTER — Other Ambulatory Visit: Payer: Self-pay

## 2019-12-28 ENCOUNTER — Encounter (HOSPITAL_COMMUNITY): Payer: Self-pay | Admitting: Family Medicine

## 2019-12-28 DIAGNOSIS — O99342 Other mental disorders complicating pregnancy, second trimester: Secondary | ICD-10-CM | POA: Insufficient documentation

## 2019-12-28 DIAGNOSIS — F319 Bipolar disorder, unspecified: Secondary | ICD-10-CM | POA: Insufficient documentation

## 2019-12-28 DIAGNOSIS — R109 Unspecified abdominal pain: Secondary | ICD-10-CM

## 2019-12-28 DIAGNOSIS — Z3A27 27 weeks gestation of pregnancy: Secondary | ICD-10-CM

## 2019-12-28 DIAGNOSIS — F419 Anxiety disorder, unspecified: Secondary | ICD-10-CM | POA: Diagnosis not present

## 2019-12-28 DIAGNOSIS — Z87891 Personal history of nicotine dependence: Secondary | ICD-10-CM | POA: Diagnosis not present

## 2019-12-28 DIAGNOSIS — O26899 Other specified pregnancy related conditions, unspecified trimester: Secondary | ICD-10-CM

## 2019-12-28 DIAGNOSIS — Z7982 Long term (current) use of aspirin: Secondary | ICD-10-CM | POA: Insufficient documentation

## 2019-12-28 DIAGNOSIS — Z79899 Other long term (current) drug therapy: Secondary | ICD-10-CM | POA: Diagnosis not present

## 2019-12-28 DIAGNOSIS — M549 Dorsalgia, unspecified: Secondary | ICD-10-CM

## 2019-12-28 DIAGNOSIS — R102 Pelvic and perineal pain: Secondary | ICD-10-CM | POA: Insufficient documentation

## 2019-12-28 DIAGNOSIS — Z3480 Encounter for supervision of other normal pregnancy, unspecified trimester: Secondary | ICD-10-CM

## 2019-12-28 DIAGNOSIS — O26892 Other specified pregnancy related conditions, second trimester: Secondary | ICD-10-CM

## 2019-12-28 LAB — WET PREP, GENITAL
Sperm: NONE SEEN
Trich, Wet Prep: NONE SEEN
Yeast Wet Prep HPF POC: NONE SEEN

## 2019-12-28 LAB — URINALYSIS, ROUTINE W REFLEX MICROSCOPIC
Bilirubin Urine: NEGATIVE
Glucose, UA: NEGATIVE mg/dL
Hgb urine dipstick: NEGATIVE
Ketones, ur: NEGATIVE mg/dL
Leukocytes,Ua: NEGATIVE
Nitrite: NEGATIVE
Protein, ur: NEGATIVE mg/dL
Specific Gravity, Urine: 1.014 (ref 1.005–1.030)
pH: 7 (ref 5.0–8.0)

## 2019-12-28 LAB — POCT FERN TEST: POCT Fern Test: NEGATIVE

## 2019-12-28 LAB — FETAL FIBRONECTIN: Fetal Fibronectin: NEGATIVE

## 2019-12-28 MED ORDER — ACETAMINOPHEN 500 MG PO TABS
1000.0000 mg | ORAL_TABLET | Freq: Four times a day (QID) | ORAL | Status: DC | PRN
  Administered 2019-12-28: 1000 mg via ORAL
  Filled 2019-12-28: qty 2

## 2019-12-28 MED ORDER — CYCLOBENZAPRINE HCL 5 MG PO TABS
10.0000 mg | ORAL_TABLET | Freq: Three times a day (TID) | ORAL | Status: DC | PRN
  Administered 2019-12-28: 10 mg via ORAL
  Filled 2019-12-28: qty 2

## 2019-12-28 MED ORDER — CYCLOBENZAPRINE HCL 10 MG PO TABS: 10.0000 mg | ORAL_TABLET | Freq: Three times a day (TID) | ORAL | 0 refills | Status: DC | PRN

## 2019-12-28 NOTE — Discharge Instructions (Signed)
Round Ligament Pain  The round ligament is a cord of muscle and tissue that helps support the uterus. It can become a source of pain during pregnancy if it becomes stretched or twisted as the baby grows. The pain usually begins in the second trimester (13-28 weeks) of pregnancy, and it can come and go until the baby is delivered. It is not a serious problem, and it does not cause harm to the baby. Round ligament pain is usually a short, sharp, and pinching pain, but it can also be a dull, lingering, and aching pain. The pain is felt in the lower side of the abdomen or in the groin. It usually starts deep in the groin and moves up to the outside of the hip area. The pain may occur when you:  Suddenly change position, such as quickly going from a sitting to standing position.  Roll over in bed.  Cough or sneeze.  Do physical activity. Follow these instructions at home:   Watch your condition for any changes.  When the pain starts, relax. Then try any of these methods to help with the pain: ? Sitting down. ? Flexing your knees up to your abdomen. ? Lying on your side with one pillow under your abdomen and another pillow between your legs. ? Sitting in a warm bath for 15-20 minutes or until the pain goes away.  Take over-the-counter and prescription medicines only as told by your health care provider.  Move slowly when you sit down or stand up.  Avoid long walks if they cause pain.  Stop or reduce your physical activities if they cause pain.  Keep all follow-up visits as told by your health care provider. This is important. Contact a health care provider if:  Your pain does not go away with treatment.  You feel pain in your back that you did not have before.  Your medicine is not helping. Get help right away if:  You have a fever or chills.  You develop uterine contractions.  You have vaginal bleeding.  You have nausea or vomiting.  You have diarrhea.  You have pain  when you urinate. Summary  Round ligament pain is felt in the lower abdomen or groin. It is usually a short, sharp, and pinching pain. It can also be a dull, lingering, and aching pain.  This pain usually begins in the second trimester (13-28 weeks). It occurs because the uterus is stretching with the growing baby, and it is not harmful to the baby.  You may notice the pain when you suddenly change position, when you cough or sneeze, or during physical activity.  Relaxing, flexing your knees to your abdomen, lying on one side, or taking a warm bath may help to get rid of the pain.  Get help from your health care provider if the pain does not go away or if you have vaginal bleeding, nausea, vomiting, diarrhea, or painful urination. This information is not intended to replace advice given to you by your health care provider. Make sure you discuss any questions you have with your health care provider. Document Revised: 12/30/2017 Document Reviewed: 12/30/2017 Elsevier Patient Education  2020 Elsevier Inc.    Abdominal Pain During Pregnancy  Belly (abdominal) pain is common during pregnancy. There are many possible causes. Most of the time, it is not a serious problem. Other times, it can be a sign that something is wrong with the pregnancy. Always tell your doctor if you have belly pain. Follow these instructions  at home:  Do not have sex or put anything in your vagina until your pain goes away completely.  Get plenty of rest until your pain gets better.  Drink enough fluid to keep your pee (urine) pale yellow.  Take over-the-counter and prescription medicines only as told by your doctor.  Keep all follow-up visits as told by your doctor. This is important. Contact a doctor if:  Your pain continues or gets worse after resting.  You have lower belly pain that: ? Comes and goes at regular times. ? Spreads to your back. ? Feels like menstrual cramps.  You have pain or burning when  you pee (urinate). Get help right away if:  You have a fever or chills.  You have vaginal bleeding.  You are leaking fluid from your vagina.  You are passing tissue from your vagina.  You throw up (vomit) for more than 24 hours.  You have watery poop (diarrhea) for more than 24 hours.  Your baby is moving less than usual.  You feel very weak or faint.  You have shortness of breath.  You have very bad pain in your upper belly. Summary  Belly (abdominal) pain is common during pregnancy. There are many possible causes.  If you have belly pain during pregnancy, tell your doctor right away.  Keep all follow-up visits as told by your doctor. This is important. This information is not intended to replace advice given to you by your health care provider. Make sure you discuss any questions you have with your health care provider. Document Revised: 11/01/2018 Document Reviewed: 10/16/2016 Elsevier Patient Education  [place].

## 2019-12-28 NOTE — MAU Note (Signed)
Wet since last night with watery d/c.  Having abd pain starts on R side and comes around to front and into vagina. Hx hyperemesis and was having IVF infusions. Have been vomiting some today. Baby is moving as much as usual but not as strong

## 2019-12-28 NOTE — MAU Provider Note (Signed)
History     CSN: [id number]  Arrival date and time: 12/28/19 4098   First Provider Initiated Contact with Patient 12/28/19 2059      Chief Complaint  Patient presents with  . Abdominal Pain  . Vaginal Discharge   24 y.o. G3P0 @27 .1 wks presenting with abdominal pain, flank pain, and back pain. Pain started today. Describes as constant but worsens intermittently. Rates pain 5/10. Has not tried anything for it. Denies urinary sx. Worsening hyperemesis today. Has only been able to tolerate water. Also reports feeling moist in the vaginal area since last night. No gush of fluid. No VB. No recent IC. Reports +FM.  OB History    Gravida  3   Para  0   Term      Preterm      AB  2   Living  0     SAB  2   TAB      Ectopic      Multiple      Live Births              Past Medical History:  Diagnosis Date  . Anxiety   . Bipolar 1 disorder ([place])   . Depression   . Hyperemesis affecting pregnancy, antepartum     Past Surgical History:  Procedure Laterality Date  . RHINOPLASTY      Family History  Problem Relation Age of Onset  . COPD Other   . Congestive Heart Failure Other   . Diabetes Other   . Anxiety disorder Mother   . Arthritis Mother   . Heart murmur Sister   . ADD / ADHD Brother   . Heart murmur Brother   . Diabetes Maternal Grandmother   . Hyperlipidemia Maternal Grandmother   . Hypertension Maternal Grandmother   . Aneurysm Maternal Grandmother     Social History   Tobacco Use  . Smoking status: Former Smoker    Packs/day: 0.50    Types: Cigarettes    Quit date: 08/18/2019    Years since quitting: 0.3  . Smokeless tobacco: Never Used  Substance Use Topics  . Alcohol use: Not Currently    Comment: 2-3 x week  . Drug use: Yes    Types: Marijuana    Comment: this morning    Allergies: No Known Allergies  No medications prior to admission.    Review of Systems  Constitutional: Negative for chills and fever.  Gastrointestinal:  Positive for abdominal pain, nausea and vomiting. Negative for constipation and diarrhea.  Genitourinary: Positive for flank pain and vaginal discharge. Negative for dysuria, hematuria and vaginal bleeding.  Musculoskeletal: Positive for back pain.   Physical Exam   Blood pressure 121/73, pulse 83, temperature 98.3 F (36.8 C), temperature source Oral, resp. rate 17, height 5\' 3"  (1.6 m), weight 103 kg, last menstrual period 06/21/2019, SpO2 96 %.  Physical Exam  Nursing note and vitals reviewed. Constitutional: She is oriented to person, place, and time. She appears well-developed and well-nourished. No distress.  HENT:  Head: Normocephalic and atraumatic.  Cardiovascular: Normal rate.  Respiratory: Effort normal. No respiratory distress.  GI: Soft. She exhibits no distension. There is no abdominal tenderness. There is no CVA tenderness.  gravid  Genitourinary:    Genitourinary Comments: SSE: no pool, fern neg SVE: closed/thick   Musculoskeletal:        General: Normal range of motion.     Cervical back: Normal range of motion. No tenderness. Normal.  Thoracic back: Normal. No tenderness.     Lumbar back: Tenderness present. No edema or deformity.  Neurological: She is alert and oriented to person, place, and time.  Skin: Skin is warm and dry.  Psychiatric: She has a normal mood and affect.  EFM: 150 bpm, mod variability, + accels, no decels Toco: none  Results for orders placed or performed during the hospital encounter of 12/28/19 (from the past 24 hour(s))  Urinalysis, Routine w reflex microscopic     Status: Abnormal   Collection Time: 12/28/19  8:23 PM  Result Value Ref Range   Color, Urine YELLOW YELLOW   APPearance CLOUDY (A) CLEAR   Specific Gravity, Urine 1.014 1.005 - 1.030   pH 7.0 5.0 - 8.0   Glucose, UA NEGATIVE NEGATIVE mg/dL   Hgb urine dipstick NEGATIVE NEGATIVE   Bilirubin Urine NEGATIVE NEGATIVE   Ketones, ur NEGATIVE NEGATIVE mg/dL   Protein, ur  NEGATIVE NEGATIVE mg/dL   Nitrite NEGATIVE NEGATIVE   Leukocytes,Ua NEGATIVE NEGATIVE  Fetal fibronectin     Status: None   Collection Time: 12/28/19  9:19 PM  Result Value Ref Range   Fetal Fibronectin NEGATIVE NEGATIVE  Wet prep, genital     Status: Abnormal   Collection Time: 12/28/19  9:19 PM  Result Value Ref Range   Yeast Wet Prep HPF POC NONE SEEN NONE SEEN   Trich, Wet Prep NONE SEEN NONE SEEN   Clue Cells Wet Prep HPF POC PRESENT (A) NONE SEEN   WBC, Wet Prep HPF POC MANY (A) NONE SEEN   Sperm NONE SEEN   POCT fern test     Status: None   Collection Time: 12/28/19 10:34 PM  Result Value Ref Range   POCT Fern Test Negative = intact amniotic membranes    MAU Course  Procedures  MDM Labs ordered and reviewed. No evidence of dehydration, SROM, or PTL. Pain improved after meds, likely RL/MSK. Discussed comfort measures. Stable for discharge home.   Assessment and Plan   1. [redacted] weeks gestation of pregnancy   2. Supervision of other normal pregnancy, antepartum   3. Pain of round ligament during pregnancy   4. Back pain affecting pregnancy in second trimester    Discharge home Follow up at [facility] as scheduled PTL precautions Rx Flexeril  Allergies as of 12/28/2019   No Known Allergies     Medication List    STOP taking these medications   hydrocortisone 25 MG suppository Commonly known as: ANUSOL-HC     TAKE these medications   aspirin EC 81 MG tablet Take 1 tablet (81 mg total) by mouth daily.   buPROPion 150 MG 24 hr tablet Commonly known as: WELLBUTRIN XL Take 150 mg by mouth daily.   busPIRone 5 MG tablet Commonly known as: BUSPAR Take 1 tablet (5 mg total) by mouth 3 (three) times daily.   cyclobenzaprine 10 MG tablet Commonly known as: FLEXERIL Take 1 tablet (10 mg total) by mouth 3 (three) times daily as needed for muscle spasms.   Diclegis 10-10 MG Tbec Generic drug: Doxylamine-Pyridoxine Diclegis 10 mg-10 mg tablet,delayed release  2 po at  hs, then 1 po in am and in afternoon.   famotidine 20 MG tablet Commonly known as: PEPCID Take by mouth.   folic acid 800 MCG tablet Commonly known as: FOLVITE Take 400 mcg by mouth daily.   LaMICtal 25 MG tablet Generic drug: lamoTRIgine Take 50 mg by mouth daily.   meclizine 12.5 MG tablet Commonly known  as: ANTIVERT meclizine 12.5 mg tablet  TAKE ONE TABLET (12.5 MG DOSE) BY MOUTH 3 TIMES DAILY FOR 10 DAYS   Prenatal 19 tablet Take by mouth.   promethazine 25 MG tablet Commonly known as: PHENERGAN Take by mouth.   sertraline 50 MG tablet Commonly known as: ZOLOFT Take 50 mg by mouth daily.   Zofran ODT 4 MG disintegrating tablet Generic drug: ondansetron Zofran ODT 4 mg disintegrating tablet  Place 2 tablets twice a day by translingual route.      Donette Larry, CNM 12/28/2019, 10:58 PM

## 2019-12-29 LAB — GC/CHLAMYDIA PROBE AMP (~~LOC~~) NOT AT ARMC
Chlamydia: NEGATIVE
Comment: NEGATIVE
Comment: NORMAL
Neisseria Gonorrhea: NEGATIVE

## 2020-01-06 ENCOUNTER — Ambulatory Visit (INDEPENDENT_AMBULATORY_CARE_PROVIDER_SITE_OTHER): Payer: Medicaid Other | Admitting: Certified Nurse Midwife

## 2020-01-06 ENCOUNTER — Other Ambulatory Visit: Payer: Self-pay

## 2020-01-06 VITALS — BP 103/70 | HR 89 | Wt 223.0 lb

## 2020-01-06 DIAGNOSIS — Z3A28 28 weeks gestation of pregnancy: Secondary | ICD-10-CM

## 2020-01-06 DIAGNOSIS — E669 Obesity, unspecified: Secondary | ICD-10-CM

## 2020-01-06 DIAGNOSIS — F1220 Cannabis dependence, uncomplicated: Secondary | ICD-10-CM

## 2020-01-06 DIAGNOSIS — O99323 Drug use complicating pregnancy, third trimester: Secondary | ICD-10-CM

## 2020-01-06 DIAGNOSIS — Z23 Encounter for immunization: Secondary | ICD-10-CM

## 2020-01-06 DIAGNOSIS — Z3480 Encounter for supervision of other normal pregnancy, unspecified trimester: Secondary | ICD-10-CM

## 2020-01-06 DIAGNOSIS — O99212 Obesity complicating pregnancy, second trimester: Secondary | ICD-10-CM

## 2020-01-06 DIAGNOSIS — F17200 Nicotine dependence, unspecified, uncomplicated: Secondary | ICD-10-CM

## 2020-01-06 MED ORDER — BLOOD GLUCOSE MONITOR KIT: PACK | 0 refills | Status: DC

## 2020-01-06 NOTE — Progress Notes (Signed)
Subjective:  Casey Wolfe is a 24 y.o. G3P0020 at [redacted]w[redacted]d being seen today for ongoing prenatal care.  She is currently monitored for the following issues for this low-risk pregnancy and has Supervision of other normal pregnancy, antepartum; Methamphetamine use disorder, severe, in sustained remission (HCC); MDD (major depressive disorder), recurrent severe, without psychosis (HCC); Borderline personality disorder (HCC); Adjustment disorder with mixed anxiety and depressed mood; ADHD (attention deficit hyperactivity disorder), combined type; Moderate cannabis use disorder (HCC); PTSD (post-traumatic stress disorder); Scoliosis; Tobacco use disorder; and Obesity affecting pregnancy in second trimester on their problem list.  Patient reports no complaints.  Contractions: Not present. Vag. Bleeding: None.  Movement: Present. Denies leaking of fluid.   The following portions of the patient's history were reviewed and updated as appropriate: allergies, current medications, past family history, past medical history, past social history, past surgical history and problem list. Problem list updated.  Objective:   Vitals:   01/06/20 0810  BP: 103/70  Pulse: 89  Weight: 223 lb (101.2 kg)    Fetal Status: Fetal Heart Rate (bpm): 159   Movement: Present     General:  Alert, oriented and cooperative. Patient is in no acute distress.  Skin: Skin is warm and dry. No rash noted.   Cardiovascular: Normal heart rate noted  Respiratory: Normal respiratory effort, no problems with respiration noted  Abdomen: Soft, gravid, appropriate for gestational age. Pain/Pressure: Absent     Pelvic: Vag. Bleeding: None Vag D/C Character: Watery   Cervical exam deferred        Extremities: Normal range of motion.  Edema: Trace  Mental Status: Normal mood and affect. Normal behavior. Normal judgment and thought content.   Urinalysis:      Assessment and Plan:  Pregnancy: G3P0020 at [redacted]w[redacted]d  1. Supervision of other normal  pregnancy, antepartum - 2Hr GTT w/ 1 Hr Carpenter 75 g - CBC - RPR - HIV antibody (with reflex)  2. Moderate cannabis use disorder (HCC) - no longer using  3. Tobacco use disorder - stopped completely  4. Obesity affecting pregnancy in second trimester - weight stable - getting more exercise now  Preterm labor symptoms and general obstetric precautions including but not limited to vaginal bleeding, contractions, leaking of fluid and fetal movement were reviewed in detail with the patient. Please refer to After Visit Summary for other counseling recommendations.  Return in about 4 weeks (around 02/03/2020). in person   Donette Larry, [place]

## 2020-01-06 NOTE — Patient Instructions (Signed)
Third Trimester of Pregnancy The third trimester is from week 28 through week 40 (months 7 through 9). The third trimester is a time when the unborn baby (fetus) is growing rapidly. At the end of the ninth month, the fetus is about 20 inches in length and weighs 6-10 pounds. Body changes during your third trimester Your body will continue to go through many changes during pregnancy. The changes vary from woman to woman. During the third trimester:  Your weight will continue to increase. You can expect to gain 25-35 pounds (11-16 kg) by the end of the pregnancy.  You may begin to get stretch marks on your hips, abdomen, and breasts.  You may urinate more often because the fetus is moving lower into your pelvis and pressing on your bladder.  You may develop or continue to have heartburn. This is caused by increased hormones that slow down muscles in the digestive tract.  You may develop or continue to have constipation because increased hormones slow digestion and cause the muscles that push waste through your intestines to relax.  You may develop hemorrhoids. These are swollen veins (varicose veins) in the rectum that can itch or be painful.  You may develop swollen, bulging veins (varicose veins) in your legs.  You may have increased body aches in the pelvis, back, or thighs. This is due to weight gain and increased hormones that are relaxing your joints.  You may have changes in your hair. These can include thickening of your hair, rapid growth, and changes in texture. Some women also have hair loss during or after pregnancy, or hair that feels dry or thin. Your hair will most likely return to normal after your baby is born.  Your breasts will continue to grow and they will continue to become tender. A yellow fluid (colostrum) may leak from your breasts. This is the first milk you are producing for your baby.  Your belly button may stick out.  You may notice more swelling in your hands,  face, or ankles.  You may have increased tingling or numbness in your hands, arms, and legs. The skin on your belly may also feel numb.  You may feel short of breath because of your expanding uterus.  You may have more problems sleeping. This can be caused by the size of your belly, increased need to urinate, and an increase in your body's metabolism.  You may notice the fetus "dropping," or moving lower in your abdomen (lightening).  You may have increased vaginal discharge.  You may notice your joints feel loose and you may have pain around your pelvic bone. What to expect at prenatal visits You will have prenatal exams every 2 weeks until week 36. Then you will have weekly prenatal exams. During a routine prenatal visit:  You will be weighed to make sure you and the baby are growing normally.  Your blood pressure will be taken.  Your abdomen will be measured to track your baby's growth.  The fetal heartbeat will be listened to.  Any test results from the previous visit will be discussed.  You may have a cervical check near your due date to see if your cervix has softened or thinned (effaced).  You will be tested for Group B streptococcus. This happens between 35 and 37 weeks. Your health care provider may ask you:  What your birth plan is.  How you are feeling.  If you are feeling the baby move.  If you have had any abnormal   symptoms, such as leaking fluid, bleeding, severe headaches, or abdominal cramping.  If you are using any tobacco products, including cigarettes, chewing tobacco, and electronic cigarettes.  If you have any questions. Other tests or screenings that may be performed during your third trimester include:  Blood tests that check for low iron levels (anemia).  Fetal testing to check the health, activity level, and growth of the fetus. Testing is done if you have certain medical conditions or if there are problems during the pregnancy.  Nonstress test  (NST). This test checks the health of your baby to make sure there are no signs of problems, such as the baby not getting enough oxygen. During this test, a belt is placed around your belly. The baby is made to move, and its heart rate is monitored during movement. What is false labor? False labor is a condition in which you feel small, irregular tightenings of the muscles in the womb (contractions) that usually go away with rest, changing position, or drinking water. These are called Braxton Hicks contractions. Contractions may last for hours, days, or even weeks before true labor sets in. If contractions come at regular intervals, become more frequent, increase in intensity, or become painful, you should see your health care provider. What are the signs of labor?  Abdominal cramps.  Regular contractions that start at 10 minutes apart and become stronger and more frequent with time.  Contractions that start on the top of the uterus and spread down to the lower abdomen and back.  Increased pelvic pressure and dull back pain.  A watery or bloody mucus discharge that comes from the vagina.  Leaking of amniotic fluid. This is also known as your "water breaking." It could be a slow trickle or a gush. Let your health care provider know if it has a color or strange odor. If you have any of these signs, call your health care provider right away, even if it is before your due date. Follow these instructions at home: Medicines  Follow your health care provider's instructions regarding medicine use. Specific medicines may be either safe or unsafe to take during pregnancy.  Take a prenatal vitamin that contains at least 600 micrograms (mcg) of folic acid.  If you develop constipation, try taking a stool softener if your health care provider approves. Eating and drinking   Eat a balanced diet that includes fresh fruits and vegetables, whole grains, good sources of protein such as meat, eggs, or tofu,  and low-fat dairy. Your health care provider will help you determine the amount of weight gain that is right for you.  Avoid raw meat and uncooked cheese. These carry germs that can cause birth defects in the baby.  If you have low calcium intake from food, talk to your health care provider about whether you should take a daily calcium supplement.  Eat four or five small meals rather than three large meals a day.  Limit foods that are high in fat and processed sugars, such as fried and sweet foods.  To prevent constipation: ? Drink enough fluid to keep your urine clear or pale yellow. ? Eat foods that are high in fiber, such as fresh fruits and vegetables, whole grains, and beans. Activity  Exercise only as directed by your health care provider. Most women can continue their usual exercise routine during pregnancy. Try to exercise for 30 minutes at least 5 days a week. Stop exercising if you experience uterine contractions.  Avoid heavy lifting.  Do   not exercise in extreme heat or humidity, or at high altitudes.  Wear low-heel, comfortable shoes.  Practice good posture.  You may continue to have sex unless your health care provider tells you otherwise. Relieving pain and discomfort  Take frequent breaks and rest with your legs elevated if you have leg cramps or low back pain.  Take warm sitz baths to soothe any pain or discomfort caused by hemorrhoids. Use hemorrhoid cream if your health care provider approves.  Wear a good support bra to prevent discomfort from breast tenderness.  If you develop varicose veins: ? Wear support pantyhose or compression stockings as told by your healthcare provider. ? Elevate your feet for 15 minutes, 3-4 times a day. Prenatal care  Write down your questions. Take them to your prenatal visits.  Keep all your prenatal visits as told by your health care provider. This is important. Safety  Wear your seat belt at all times when driving.  Make  a list of emergency phone numbers, including numbers for family, friends, the hospital, and police and fire departments. General instructions  Avoid cat litter boxes and soil used by cats. These carry germs that can cause birth defects in the baby. If you have a cat, ask someone to clean the litter box for you.  Do not travel far distances unless it is absolutely necessary and only with the approval of your health care provider.  Do not use hot tubs, steam rooms, or saunas.  Do not drink alcohol.  Do not use any products that contain nicotine or tobacco, such as cigarettes and e-cigarettes. If you need help quitting, ask your health care provider.  Do not use any medicinal herbs or unprescribed drugs. These chemicals affect the formation and growth of the baby.  Do not douche or use tampons or scented sanitary pads.  Do not cross your legs for long periods of time.  To prepare for the arrival of your baby: ? Take prenatal classes to understand, practice, and ask questions about labor and delivery. ? Make a trial run to the hospital. ? Visit the hospital and tour the maternity area. ? Arrange for maternity or paternity leave through employers. ? Arrange for family and friends to take care of pets while you are in the hospital. ? Purchase a rear-facing car seat and make sure you know how to install it in your car. ? Pack your hospital bag. ? Prepare the baby's nursery. Make sure to remove all pillows and stuffed animals from the baby's crib to prevent suffocation.  Visit your dentist if you have not gone during your pregnancy. Use a soft toothbrush to brush your teeth and be gentle when you floss. Contact a health care provider if:  You are unsure if you are in labor or if your water has broken.  You become dizzy.  You have mild pelvic cramps, pelvic pressure, or nagging pain in your abdominal area.  You have lower back pain.  You have persistent nausea, vomiting, or  diarrhea.  You have an unusual or bad smelling vaginal discharge.  You have pain when you urinate. Get help right away if:  Your water breaks before 37 weeks.  You have regular contractions less than 5 minutes apart before 37 weeks.  You have a fever.  You are leaking fluid from your vagina.  You have spotting or bleeding from your vagina.  You have severe abdominal pain or cramping.  You have rapid weight loss or weight gain.  You have   shortness of breath with chest pain.  You notice sudden or extreme swelling of your face, hands, ankles, feet, or legs.  Your baby makes fewer than 10 movements in 2 hours.  You have severe headaches that do not go away when you take medicine.  You have vision changes. Summary  The third trimester is from week 28 through week 40, months 7 through 9. The third trimester is a time when the unborn baby (fetus) is growing rapidly.  During the third trimester, your discomfort may increase as you and your baby continue to gain weight. You may have abdominal, leg, and back pain, sleeping problems, and an increased need to urinate.  During the third trimester your breasts will keep growing and they will continue to become tender. A yellow fluid (colostrum) may leak from your breasts. This is the first milk you are producing for your baby.  False labor is a condition in which you feel small, irregular tightenings of the muscles in the womb (contractions) that eventually go away. These are called Braxton Hicks contractions. Contractions may last for hours, days, or even weeks before true labor sets in.  Signs of labor can include: abdominal cramps; regular contractions that start at 10 minutes apart and become stronger and more frequent with time; watery or bloody mucus discharge that comes from the vagina; increased pelvic pressure and dull back pain; and leaking of amniotic fluid. This information is not intended to replace advice given to you by your  health care provider. Make sure you discuss any questions you have with your health care provider. Document Revised: 11/04/2018 Document Reviewed: 08/19/2016 Elsevier Patient Education  2020 [employer]  

## 2020-01-06 NOTE — Progress Notes (Signed)
Pt went for 2 hr GTT and threw up Glucola.  She prefers to do finger stick QID instead of trying the drink again.  RX for Glucometer, lancets and strips sent to her pharmacy.  Appt made for 2 weeks to review CBG's

## 2020-01-06 NOTE — Addendum Note (Signed)
Addended by: Granville Lewis on: 01/06/2020 10:11 AM   Modules accepted: Orders

## 2020-01-10 ENCOUNTER — Other Ambulatory Visit: Payer: Self-pay

## 2020-01-10 ENCOUNTER — Other Ambulatory Visit (INDEPENDENT_AMBULATORY_CARE_PROVIDER_SITE_OTHER): Payer: Medicaid Other

## 2020-01-10 DIAGNOSIS — Z3480 Encounter for supervision of other normal pregnancy, unspecified trimester: Secondary | ICD-10-CM | POA: Diagnosis not present

## 2020-01-10 NOTE — Progress Notes (Signed)
Pt here for 2 hr GTT only.  She threw up the drink last week and is here for a second try.

## 2020-01-11 ENCOUNTER — Telehealth: Payer: Self-pay | Admitting: *Deleted

## 2020-01-11 LAB — GLUCOSE TOLERANCE, 2 HOURS W/ 1HR
Glucose, 1 hour: 133 mg/dL (ref 65–199)
Glucose, 2 hour: 82 mg/dL (ref 65–139)
Glucose, Fasting: 66 mg/dL (ref 65–99)

## 2020-01-11 NOTE — Telephone Encounter (Signed)
Patient states that she has not felt baby move in 15 hours and is concerned. Patient had a fall on her bottom on 01/10/2020. Patient advised to be evaluated in MAU at the [facility] at [facility]. There is no provider to see her at th [place] office on 01/11/2020. Patient agreed to treatment plan.

## 2020-01-20 ENCOUNTER — Encounter: Payer: Medicaid Other | Admitting: Certified Nurse Midwife

## 2020-01-31 ENCOUNTER — Ambulatory Visit (INDEPENDENT_AMBULATORY_CARE_PROVIDER_SITE_OTHER): Payer: Medicaid Other | Admitting: Advanced Practice Midwife

## 2020-01-31 ENCOUNTER — Other Ambulatory Visit: Payer: Self-pay

## 2020-01-31 VITALS — BP 114/71 | HR 78 | Wt 232.0 lb

## 2020-01-31 DIAGNOSIS — Z3483 Encounter for supervision of other normal pregnancy, third trimester: Secondary | ICD-10-CM

## 2020-01-31 DIAGNOSIS — Z3A32 32 weeks gestation of pregnancy: Secondary | ICD-10-CM

## 2020-01-31 DIAGNOSIS — Z3480 Encounter for supervision of other normal pregnancy, unspecified trimester: Secondary | ICD-10-CM

## 2020-01-31 NOTE — Patient Instructions (Signed)
Third Trimester of Pregnancy The third trimester is from week 28 through week 40 (months 7 through 9). The third trimester is a time when the unborn baby (fetus) is growing rapidly. At the end of the ninth month, the fetus is about 20 inches in length and weighs 6-10 pounds. Body changes during your third trimester Your body will continue to go through many changes during pregnancy. The changes vary from woman to woman. During the third trimester:  Your weight will continue to increase. You can expect to gain 25-35 pounds (11-16 kg) by the end of the pregnancy.  You may begin to get stretch marks on your hips, abdomen, and breasts.  You may urinate more often because the fetus is moving lower into your pelvis and pressing on your bladder.  You may develop or continue to have heartburn. This is caused by increased hormones that slow down muscles in the digestive tract.  You may develop or continue to have constipation because increased hormones slow digestion and cause the muscles that push waste through your intestines to relax.  You may develop hemorrhoids. These are swollen veins (varicose veins) in the rectum that can itch or be painful.  You may develop swollen, bulging veins (varicose veins) in your legs.  You may have increased body aches in the pelvis, back, or thighs. This is due to weight gain and increased hormones that are relaxing your joints.  You may have changes in your hair. These can include thickening of your hair, rapid growth, and changes in texture. Some women also have hair loss during or after pregnancy, or hair that feels dry or thin. Your hair will most likely return to normal after your baby is born.  Your breasts will continue to grow and they will continue to become tender. A yellow fluid (colostrum) may leak from your breasts. This is the first milk you are producing for your baby.  Your belly button may stick out.  You may notice more swelling in your hands,  face, or ankles.  You may have increased tingling or numbness in your hands, arms, and legs. The skin on your belly may also feel numb.  You may feel short of breath because of your expanding uterus.  You may have more problems sleeping. This can be caused by the size of your belly, increased need to urinate, and an increase in your body's metabolism.  You may notice the fetus "dropping," or moving lower in your abdomen (lightening).  You may have increased vaginal discharge.  You may notice your joints feel loose and you may have pain around your pelvic bone. What to expect at prenatal visits You will have prenatal exams every 2 weeks until week 36. Then you will have weekly prenatal exams. During a routine prenatal visit:  You will be weighed to make sure you and the baby are growing normally.  Your blood pressure will be taken.  Your abdomen will be measured to track your baby's growth.  The fetal heartbeat will be listened to.  Any test results from the previous visit will be discussed.  You may have a cervical check near your due date to see if your cervix has softened or thinned (effaced).  You will be tested for Group B streptococcus. This happens between 35 and 37 weeks. Your health care provider may ask you:  What your birth plan is.  How you are feeling.  If you are feeling the baby move.  If you have had any abnormal   symptoms, such as leaking fluid, bleeding, severe headaches, or abdominal cramping.  If you are using any tobacco products, including cigarettes, chewing tobacco, and electronic cigarettes.  If you have any questions. Other tests or screenings that may be performed during your third trimester include:  Blood tests that check for low iron levels (anemia).  Fetal testing to check the health, activity level, and growth of the fetus. Testing is done if you have certain medical conditions or if there are problems during the pregnancy.  Nonstress test  (NST). This test checks the health of your baby to make sure there are no signs of problems, such as the baby not getting enough oxygen. During this test, a belt is placed around your belly. The baby is made to move, and its heart rate is monitored during movement. What is false labor? False labor is a condition in which you feel small, irregular tightenings of the muscles in the womb (contractions) that usually go away with rest, changing position, or drinking water. These are called Braxton Hicks contractions. Contractions may last for hours, days, or even weeks before true labor sets in. If contractions come at regular intervals, become more frequent, increase in intensity, or become painful, you should see your health care provider. What are the signs of labor?  Abdominal cramps.  Regular contractions that start at 10 minutes apart and become stronger and more frequent with time.  Contractions that start on the top of the uterus and spread down to the lower abdomen and back.  Increased pelvic pressure and dull back pain.  A watery or bloody mucus discharge that comes from the vagina.  Leaking of amniotic fluid. This is also known as your "water breaking." It could be a slow trickle or a gush. Let your health care provider know if it has a color or strange odor. If you have any of these signs, call your health care provider right away, even if it is before your due date. Follow these instructions at home: Medicines  Follow your health care provider's instructions regarding medicine use. Specific medicines may be either safe or unsafe to take during pregnancy.  Take a prenatal vitamin that contains at least 600 micrograms (mcg) of folic acid.  If you develop constipation, try taking a stool softener if your health care provider approves. Eating and drinking   Eat a balanced diet that includes fresh fruits and vegetables, whole grains, good sources of protein such as meat, eggs, or tofu,  and low-fat dairy. Your health care provider will help you determine the amount of weight gain that is right for you.  Avoid raw meat and uncooked cheese. These carry germs that can cause birth defects in the baby.  If you have low calcium intake from food, talk to your health care provider about whether you should take a daily calcium supplement.  Eat four or five small meals rather than three large meals a day.  Limit foods that are high in fat and processed sugars, such as fried and sweet foods.  To prevent constipation: ? Drink enough fluid to keep your urine clear or pale yellow. ? Eat foods that are high in fiber, such as fresh fruits and vegetables, whole grains, and beans. Activity  Exercise only as directed by your health care provider. Most women can continue their usual exercise routine during pregnancy. Try to exercise for 30 minutes at least 5 days a week. Stop exercising if you experience uterine contractions.  Avoid heavy lifting.  Do   not exercise in extreme heat or humidity, or at high altitudes.  Wear low-heel, comfortable shoes.  Practice good posture.  You may continue to have sex unless your health care provider tells you otherwise. Relieving pain and discomfort  Take frequent breaks and rest with your legs elevated if you have leg cramps or low back pain.  Take warm sitz baths to soothe any pain or discomfort caused by hemorrhoids. Use hemorrhoid cream if your health care provider approves.  Wear a good support bra to prevent discomfort from breast tenderness.  If you develop varicose veins: ? Wear support pantyhose or compression stockings as told by your healthcare provider. ? Elevate your feet for 15 minutes, 3-4 times a day. Prenatal care  Write down your questions. Take them to your prenatal visits.  Keep all your prenatal visits as told by your health care provider. This is important. Safety  Wear your seat belt at all times when driving.  Make  a list of emergency phone numbers, including numbers for family, friends, the hospital, and police and fire departments. General instructions  Avoid cat litter boxes and soil used by cats. These carry germs that can cause birth defects in the baby. If you have a cat, ask someone to clean the litter box for you.  Do not travel far distances unless it is absolutely necessary and only with the approval of your health care provider.  Do not use hot tubs, steam rooms, or saunas.  Do not drink alcohol.  Do not use any products that contain nicotine or tobacco, such as cigarettes and e-cigarettes. If you need help quitting, ask your health care provider.  Do not use any medicinal herbs or unprescribed drugs. These chemicals affect the formation and growth of the baby.  Do not douche or use tampons or scented sanitary pads.  Do not cross your legs for long periods of time.  To prepare for the arrival of your baby: ? Take prenatal classes to understand, practice, and ask questions about labor and delivery. ? Make a trial run to the hospital. ? Visit the hospital and tour the maternity area. ? Arrange for maternity or paternity leave through employers. ? Arrange for family and friends to take care of pets while you are in the hospital. ? Purchase a rear-facing car seat and make sure you know how to install it in your car. ? Pack your hospital bag. ? Prepare the baby's nursery. Make sure to remove all pillows and stuffed animals from the baby's crib to prevent suffocation.  Visit your dentist if you have not gone during your pregnancy. Use a soft toothbrush to brush your teeth and be gentle when you floss. Contact a health care provider if:  You are unsure if you are in labor or if your water has broken.  You become dizzy.  You have mild pelvic cramps, pelvic pressure, or nagging pain in your abdominal area.  You have lower back pain.  You have persistent nausea, vomiting, or  diarrhea.  You have an unusual or bad smelling vaginal discharge.  You have pain when you urinate. Get help right away if:  Your water breaks before 37 weeks.  You have regular contractions less than 5 minutes apart before 37 weeks.  You have a fever.  You are leaking fluid from your vagina.  You have spotting or bleeding from your vagina.  You have severe abdominal pain or cramping.  You have rapid weight loss or weight gain.  You have   shortness of breath with chest pain.  You notice sudden or extreme swelling of your face, hands, ankles, feet, or legs.  Your baby makes fewer than 10 movements in 2 hours.  You have severe headaches that do not go away when you take medicine.  You have vision changes. Summary  The third trimester is from week 28 through week 40, months 7 through 9. The third trimester is a time when the unborn baby (fetus) is growing rapidly.  During the third trimester, your discomfort may increase as you and your baby continue to gain weight. You may have abdominal, leg, and back pain, sleeping problems, and an increased need to urinate.  During the third trimester your breasts will keep growing and they will continue to become tender. A yellow fluid (colostrum) may leak from your breasts. This is the first milk you are producing for your baby.  False labor is a condition in which you feel small, irregular tightenings of the muscles in the womb (contractions) that eventually go away. These are called Braxton Hicks contractions. Contractions may last for hours, days, or even weeks before true labor sets in.  Signs of labor can include: abdominal cramps; regular contractions that start at 10 minutes apart and become stronger and more frequent with time; watery or bloody mucus discharge that comes from the vagina; increased pelvic pressure and dull back pain; and leaking of amniotic fluid. This information is not intended to replace advice given to you by your  health care provider. Make sure you discuss any questions you have with your health care provider. Document Revised: 11/04/2018 Document Reviewed: 08/19/2016 Elsevier Patient Education  2020 [employer]  

## 2020-01-31 NOTE — Progress Notes (Signed)
   PRENATAL VISIT NOTE  Subjective:  Casey Wolfe is a 24 y.o. G3P0020 at [redacted]w[redacted]d being seen today for ongoing prenatal care.  She is currently monitored for the following issues for this low-risk pregnancy and has Supervision of other normal pregnancy, antepartum; Methamphetamine use disorder, severe, in sustained remission (HCC); MDD (major depressive disorder), recurrent severe, without psychosis (HCC); Borderline personality disorder (HCC); Adjustment disorder with mixed anxiety and depressed mood; ADHD (attention deficit hyperactivity disorder), combined type; Moderate cannabis use disorder (HCC); PTSD (post-traumatic stress disorder); Scoliosis; Tobacco use disorder; and Obesity affecting pregnancy in second trimester on their problem list.  Patient reports no complaints.  Contractions: Irritability. Vag. Bleeding: None.  Movement: Present. Denies leaking of fluid.   The following portions of the patient's history were reviewed and updated as appropriate: allergies, current medications, past family history, past medical history, past social history, past surgical history and problem list.   Objective:   Vitals:   01/31/20 1112  BP: 114/71  Pulse: 78  Weight: 232 lb (105.2 kg)    Fetal Status: Fetal Heart Rate (bpm): 152   Movement: Present     General:  Alert, oriented and cooperative. Patient is in no acute distress.  Skin: Skin is warm and dry. No rash noted.   Cardiovascular: Normal heart rate noted  Respiratory: Normal respiratory effort, no problems with respiration noted  Abdomen: Soft, gravid, appropriate for gestational age.  Pain/Pressure: Present     Pelvic: Cervical exam deferred        Extremities: Normal range of motion.  Edema: None  Mental Status: Normal mood and affect. Normal behavior. Normal judgment and thought content.   Assessment and Plan:  Pregnancy: G3P0020 at [redacted]w[redacted]d 1. Supervision of other normal pregnancy, antepartum --Anticipatory guidance about next  visits/weeks of pregnancy given. --N/V improved now, only occasional vomiting --Next visit in 4 weeks in office for GBS --Interested in waterbirth, took class and has certificate, needs consent at next visit --Has written birth plan, will bring at next visit  Preterm labor symptoms and general obstetric precautions including but not limited to vaginal bleeding, contractions, leaking of fluid and fetal movement were reviewed in detail with the patient. Please refer to After Visit Summary for other counseling recommendations.   Return in about 4 weeks (around 02/28/2020).  Future Appointments  Date Time Provider Department Center  02/01/2020 11:15 AM [facility]  03/01/2020  1:30 PM Federico Flake, MD [facility]    Sharen Counter, CNM

## 2020-02-01 ENCOUNTER — Ambulatory Visit: Payer: Medicaid Other | Attending: Obstetrics and Gynecology

## 2020-02-01 DIAGNOSIS — E669 Obesity, unspecified: Secondary | ICD-10-CM

## 2020-02-01 DIAGNOSIS — Z720 Tobacco use: Secondary | ICD-10-CM | POA: Insufficient documentation

## 2020-02-01 DIAGNOSIS — O99340 Other mental disorders complicating pregnancy, unspecified trimester: Secondary | ICD-10-CM

## 2020-02-01 DIAGNOSIS — Z3A32 32 weeks gestation of pregnancy: Secondary | ICD-10-CM

## 2020-02-01 DIAGNOSIS — F99 Mental disorder, not otherwise specified: Secondary | ICD-10-CM

## 2020-02-01 DIAGNOSIS — O99332 Smoking (tobacco) complicating pregnancy, second trimester: Secondary | ICD-10-CM

## 2020-02-01 DIAGNOSIS — O99212 Obesity complicating pregnancy, second trimester: Secondary | ICD-10-CM

## 2020-02-01 DIAGNOSIS — F17200 Nicotine dependence, unspecified, uncomplicated: Secondary | ICD-10-CM

## 2020-02-03 ENCOUNTER — Encounter: Payer: Medicaid Other | Admitting: Certified Nurse Midwife

## 2020-02-11 DIAGNOSIS — L720 Epidermal cyst: Secondary | ICD-10-CM | POA: Diagnosis not present

## 2020-02-13 ENCOUNTER — Ambulatory Visit: Payer: Self-pay

## 2020-02-15 ENCOUNTER — Telehealth: Payer: Self-pay | Admitting: *Deleted

## 2020-02-15 NOTE — Telephone Encounter (Signed)
Patient called and stated that she fell/passed out in a store that she was in. Before falling/passing out she felt hot. Patient does not remember how she landed. Patient asked if she has been drinking at least 72 oz of water daily, doesn't think she has. She does drink water but not that much, Body Armor, and eats a lot of ice. Advised patient to increase daily water intake, especially in summer weather and to be evaluated at MAU for her fall. [facility] does not have a provider in office today. Patient agreed to plan of care.

## 2020-03-01 ENCOUNTER — Other Ambulatory Visit (HOSPITAL_COMMUNITY)
Admission: RE | Admit: 2020-03-01 | Discharge: 2020-03-01 | Disposition: A | Discharge status: Home / Self Care | Payer: Medicaid Other | Source: Ambulatory Visit | Attending: Family Medicine | Admitting: Family Medicine

## 2020-03-01 ENCOUNTER — Encounter: Payer: Self-pay | Admitting: Obstetrics & Gynecology

## 2020-03-01 ENCOUNTER — Other Ambulatory Visit: Payer: Self-pay

## 2020-03-01 ENCOUNTER — Ambulatory Visit (INDEPENDENT_AMBULATORY_CARE_PROVIDER_SITE_OTHER): Payer: Medicaid Other | Admitting: Obstetrics & Gynecology

## 2020-03-01 VITALS — BP 124/80 | HR 103 | Wt 244.0 lb

## 2020-03-01 DIAGNOSIS — O99213 Obesity complicating pregnancy, third trimester: Secondary | ICD-10-CM

## 2020-03-01 DIAGNOSIS — Z3480 Encounter for supervision of other normal pregnancy, unspecified trimester: Secondary | ICD-10-CM | POA: Insufficient documentation

## 2020-03-01 DIAGNOSIS — Z3A36 36 weeks gestation of pregnancy: Secondary | ICD-10-CM

## 2020-03-01 DIAGNOSIS — F419 Anxiety disorder, unspecified: Secondary | ICD-10-CM

## 2020-03-01 DIAGNOSIS — O99343 Other mental disorders complicating pregnancy, third trimester: Secondary | ICD-10-CM

## 2020-03-01 LAB — OB RESULTS CONSOLE GBS: GBS: POSITIVE

## 2020-03-01 NOTE — Patient Instructions (Signed)
Return to office for any scheduled appointments. Call the office or go to the [facility] at [facility] [place] if:  You begin to have strong, frequent contractions  Your water breaks.  Sometimes it is a big gush of fluid, sometimes it is just a trickle that keeps getting your panties wet or running down your legs  You have vaginal bleeding.  It is normal to have a small amount of spotting if your cervix was checked.   You do not feel your baby moving like normal.  If you do not, get something to eat and drink and lay down and focus on feeling your baby move.   If your baby is still not moving like normal, you should call the office or go to MAU.  Any other obstetric concerns.   

## 2020-03-01 NOTE — Progress Notes (Signed)
   PRENATAL VISIT NOTE  Subjective:  Casey Wolfe is a 24 y.o. G3P0020 at [redacted]w[redacted]d being seen today for ongoing prenatal care.  She is currently monitored for the following issues for this low-risk pregnancy and has Supervision of other normal pregnancy, antepartum; Methamphetamine use disorder, severe, in sustained remission (HCC); MDD (major depressive disorder), recurrent severe, without psychosis (HCC); Borderline personality disorder (HCC); Adjustment disorder with mixed anxiety and depressed mood; ADHD (attention deficit hyperactivity disorder), combined type; Moderate cannabis use disorder (HCC); PTSD (post-traumatic stress disorder); Scoliosis; Tobacco use disorder; and Obesity affecting pregnancy in second trimester on their problem list.  Patient reports no complaints.  Contractions: Irritability. Vag. Bleeding: None.  Movement: Present. Denies leaking of fluid.   The following portions of the patient's history were reviewed and updated as appropriate: allergies, current medications, past family history, past medical history, past social history, past surgical history and problem list.   Objective:   Vitals:   03/01/20 1330  BP: 124/80  Pulse: (!) 103  Weight: 244 lb (110.7 kg)    Fetal Status: Fetal Heart Rate (bpm): 153 Fundal Height: 36 cm Movement: Present  Presentation: Vertex  General:  Alert, oriented and cooperative. Patient is in no acute distress.  Skin: Skin is warm and dry. No rash noted.   Cardiovascular: Normal heart rate noted  Respiratory: Normal respiratory effort, no problems with respiration noted  Abdomen: Soft, gravid, appropriate for gestational age.  Pain/Pressure: Present     Pelvic: Cervical exam performed in the presence of a chaperone Dilation: Closed Effacement (%): Thick Station: Ballotable  Extremities: Normal range of motion.  Edema: None  Mental Status: Normal mood and affect. Normal behavior. Normal judgment and thought content.   Assessment and  Plan:  Pregnancy: G3P0020 at [redacted]w[redacted]d 1. Obesity affecting pregnancy in third trimester TWG 35 lb  2. Anxiety during pregnancy in third trimester, antepartum No issues. Continue Buspar and Wellbutrin.  3. [redacted] weeks gestation of pregnancy 4. Supervision of other normal pregnancy, antepartum Pelvic cultures done.  - Culture, beta strep (group b only) - Cervicovaginal ancillary only( [place]) Preterm labor symptoms and general obstetric precautions including but not limited to vaginal bleeding, contractions, leaking of fluid and fetal movement were reviewed in detail with the patient. Please refer to After Visit Summary for other counseling recommendations.   Return in about 1 week (around 03/08/2020) for OFFICE OB Visit.  No future appointments.  Jaynie Collins, MD

## 2020-03-02 LAB — CERVICOVAGINAL ANCILLARY ONLY
Chlamydia: NEGATIVE
Comment: NEGATIVE
Comment: NORMAL
Neisseria Gonorrhea: NEGATIVE

## 2020-03-03 LAB — CULTURE, BETA STREP (GROUP B ONLY)
MICRO NUMBER:: 10794926
SPECIMEN QUALITY:: ADEQUATE

## 2020-03-04 ENCOUNTER — Encounter: Payer: Self-pay | Admitting: Obstetrics & Gynecology

## 2020-03-04 DIAGNOSIS — O99820 Streptococcus B carrier state complicating pregnancy: Secondary | ICD-10-CM | POA: Insufficient documentation

## 2020-03-08 ENCOUNTER — Encounter: Payer: Self-pay | Admitting: Obstetrics and Gynecology

## 2020-03-08 ENCOUNTER — Ambulatory Visit (INDEPENDENT_AMBULATORY_CARE_PROVIDER_SITE_OTHER): Payer: Medicaid Other | Admitting: Obstetrics and Gynecology

## 2020-03-08 ENCOUNTER — Other Ambulatory Visit: Payer: Self-pay

## 2020-03-08 VITALS — BP 126/80 | HR 86 | Wt 249.0 lb

## 2020-03-08 DIAGNOSIS — O99213 Obesity complicating pregnancy, third trimester: Secondary | ICD-10-CM

## 2020-03-08 DIAGNOSIS — Z7189 Other specified counseling: Secondary | ICD-10-CM | POA: Diagnosis not present

## 2020-03-08 DIAGNOSIS — Z3A37 37 weeks gestation of pregnancy: Secondary | ICD-10-CM

## 2020-03-08 DIAGNOSIS — F4310 Post-traumatic stress disorder, unspecified: Secondary | ICD-10-CM

## 2020-03-08 DIAGNOSIS — Z3480 Encounter for supervision of other normal pregnancy, unspecified trimester: Secondary | ICD-10-CM

## 2020-03-08 DIAGNOSIS — F1210 Cannabis abuse, uncomplicated: Secondary | ICD-10-CM

## 2020-03-08 DIAGNOSIS — Z720 Tobacco use: Secondary | ICD-10-CM

## 2020-03-08 DIAGNOSIS — F609 Personality disorder, unspecified: Secondary | ICD-10-CM

## 2020-03-08 DIAGNOSIS — O99212 Obesity complicating pregnancy, second trimester: Secondary | ICD-10-CM

## 2020-03-08 DIAGNOSIS — O99323 Drug use complicating pregnancy, third trimester: Secondary | ICD-10-CM

## 2020-03-08 DIAGNOSIS — O99343 Other mental disorders complicating pregnancy, third trimester: Secondary | ICD-10-CM

## 2020-03-08 DIAGNOSIS — O99893 Other specified diseases and conditions complicating puerperium: Secondary | ICD-10-CM

## 2020-03-08 DIAGNOSIS — O99820 Streptococcus B carrier state complicating pregnancy: Secondary | ICD-10-CM

## 2020-03-08 DIAGNOSIS — F4323 Adjustment disorder with mixed anxiety and depressed mood: Secondary | ICD-10-CM

## 2020-03-08 DIAGNOSIS — B951 Streptococcus, group B, as the cause of diseases classified elsewhere: Secondary | ICD-10-CM

## 2020-03-08 DIAGNOSIS — E669 Obesity, unspecified: Secondary | ICD-10-CM

## 2020-03-08 NOTE — Progress Notes (Signed)
   PRENATAL VISIT NOTE  Subjective:  Casey Wolfe is a 24 y.o. G3P0020 at [redacted]w[redacted]d being seen today for ongoing prenatal care.  She is currently monitored for the following issues for this low-risk pregnancy and has Supervision of other normal pregnancy, antepartum; Methamphetamine use disorder, severe, in sustained remission (HCC); MDD (major depressive disorder), recurrent severe, without psychosis (HCC); Borderline personality disorder (HCC); Adjustment disorder with mixed anxiety and depressed mood; ADHD (attention deficit hyperactivity disorder), combined type; Moderate cannabis use disorder (HCC); PTSD (post-traumatic stress disorder); Scoliosis; Tobacco use disorder; Obesity affecting pregnancy in second trimester; and Group B Streptococcus carrier, +RV culture, currently pregnant on their problem list.  Patient reports some swelling in feet and hands, craving ice occasionally, .  Contractions: Irritability. Vag. Bleeding: None.  Movement: Present. Denies leaking of fluid.   The following portions of the patient's history were reviewed and updated as appropriate: allergies, current medications, past family history, past medical history, past social history, past surgical history and problem list.   Objective:   Vitals:   03/08/20 1300  BP: 126/80  Pulse: 86  Weight: 249 lb (112.9 kg)    Fetal Status: Fetal Heart Rate (bpm): 154   Movement: Present     General:  Alert, oriented and cooperative. Patient is in no acute distress.  Skin: Skin is warm and dry. No rash noted.   Cardiovascular: Normal heart rate noted  Respiratory: Normal respiratory effort, no problems with respiration noted  Abdomen: Soft, gravid, appropriate for gestational age.  Pain/Pressure: Present   Cephalic by palpation  Pelvic: Cervical exam deferred        Extremities: Normal range of motion.  Edema: Trace  Mental Status: Normal mood and affect. Normal behavior. Normal judgment and thought content.   Assessment and  Plan:  Pregnancy: G3P0020 at [redacted]w[redacted]d  1. [redacted] weeks gestation of pregnancy  2. Group B Streptococcus carrier, +RV culture, currently pregnant ppx in labor  3. Obesity affecting pregnancy in second trimester  4. Supervision of other normal pregnancy, antepartum Needs 3rd trim HIV/RPR /CBC  5. COVID counseling The patient was counseled on the potential benefits and lack of known risks of COVID vaccination, during pregnancy and breastfeeding, on today's visit. The patient's questions and concerns were addressed today, including worries about newness of vaccine. The patient is planning to get vaccinated after delivery. The patient is aware that if she chooses not to get an employee mandated vaccination we will provide documentation for her employers in the form of a letter unless a specific exemption form is submitted to the provider.   Term labor symptoms and general obstetric precautions including but not limited to vaginal bleeding, contractions, leaking of fluid and fetal movement were reviewed in detail with the patient. Please refer to After Visit Summary for other counseling recommendations.   Return in about 1 week (around 03/15/2020) for low OB, in person.  No future appointments.  Conan Bowens, MD

## 2020-03-09 LAB — CBC
HCT: 31.8 % — ABNORMAL LOW (ref 35.0–45.0)
Hemoglobin: 10.5 g/dL — ABNORMAL LOW (ref 11.7–15.5)
MCH: 27.9 pg (ref 27.0–33.0)
MCHC: 33 g/dL (ref 32.0–36.0)
MCV: 84.6 fL (ref 80.0–100.0)
MPV: 11 fL (ref 7.5–12.5)
Platelets: 250 10*3/uL (ref 140–400)
RBC: 3.76 10*6/uL — ABNORMAL LOW (ref 3.80–5.10)
RDW: 13.8 % (ref 11.0–15.0)
WBC: 10.9 10*3/uL — ABNORMAL HIGH (ref 3.8–10.8)

## 2020-03-09 LAB — HIV ANTIBODY (ROUTINE TESTING W REFLEX): HIV 1&2 Ab, 4th Generation: NONREACTIVE

## 2020-03-09 LAB — RPR: RPR Ser Ql: NONREACTIVE

## 2020-03-13 ENCOUNTER — Other Ambulatory Visit: Payer: Self-pay

## 2020-03-13 ENCOUNTER — Ambulatory Visit (INDEPENDENT_AMBULATORY_CARE_PROVIDER_SITE_OTHER): Payer: Medicaid Other | Admitting: Advanced Practice Midwife

## 2020-03-13 VITALS — BP 118/74 | HR 82 | Temp 98.1°F | Wt 252.0 lb

## 2020-03-13 DIAGNOSIS — Z3A38 38 weeks gestation of pregnancy: Secondary | ICD-10-CM

## 2020-03-13 DIAGNOSIS — O99820 Streptococcus B carrier state complicating pregnancy: Secondary | ICD-10-CM

## 2020-03-13 DIAGNOSIS — Z3480 Encounter for supervision of other normal pregnancy, unspecified trimester: Secondary | ICD-10-CM

## 2020-03-13 DIAGNOSIS — K2100 Gastro-esophageal reflux disease with esophagitis, without bleeding: Secondary | ICD-10-CM

## 2020-03-13 DIAGNOSIS — R079 Chest pain, unspecified: Secondary | ICD-10-CM

## 2020-03-13 DIAGNOSIS — O368130 Decreased fetal movements, third trimester, not applicable or unspecified: Secondary | ICD-10-CM

## 2020-03-13 MED ORDER — PANTOPRAZOLE SODIUM 40 MG PO TBEC
40.0000 mg | DELAYED_RELEASE_TABLET | Freq: Every day | ORAL | 0 refills | Status: DC
Start: 2020-03-13 — End: 2020-03-20

## 2020-03-13 NOTE — Progress Notes (Signed)
Pt c/o decreased fetal movement and chest pain

## 2020-03-13 NOTE — Progress Notes (Signed)
° °  PRENATAL VISIT NOTE  Subjective:  Casey Wolfe is a 24 y.o. G3P0020 at [redacted]w[redacted]d being seen today for ongoing prenatal care.  She is currently monitored for the following issues for this low-risk pregnancy and has Supervision of other normal pregnancy, antepartum; Methamphetamine use disorder, severe, in sustained remission (HCC); MDD (major depressive disorder), recurrent severe, without psychosis (HCC); Borderline personality disorder (HCC); Adjustment disorder with mixed anxiety and depressed mood; ADHD (attention deficit hyperactivity disorder), combined type; Moderate cannabis use disorder (HCC); PTSD (post-traumatic stress disorder); Scoliosis; Tobacco use disorder; Obesity affecting pregnancy in second trimester; and Group B Streptococcus carrier, +RV culture, currently pregnant on their problem list.  Patient reports occasional contractions and chest pain/palpitations, frequent heartburn, and decreased fetal movement  yesterday.  Contractions: Irritability. Vag. Bleeding: None.  Movement: (!) Decreased. Denies leaking of fluid.   The following portions of the patient's history were reviewed and updated as appropriate: allergies, current medications, past family history, past medical history, past social history, past surgical history and problem list.   Objective:   Vitals:   03/13/20 1448  BP: 118/74  Pulse: 82  Temp: 98.1 F (36.7 C)  Weight: 252 lb (114.3 kg)    Fetal Status: Fetal Heart Rate (bpm): NST-R Fundal Height: 38 cm Movement: (!) Decreased  Presentation: Vertex  General:  Alert, oriented and cooperative. Patient is in no acute distress.  Skin: Skin is warm and dry. No rash noted.   Cardiovascular: Normal heart rate noted  Respiratory: Normal respiratory effort, no problems with respiration noted  Abdomen: Soft, gravid, appropriate for gestational age.  Pain/Pressure: Present     Pelvic: Cervical exam performed in the presence of a chaperone Dilation: Fingertip Effacement  (%): Thick Station: Ballotable  Extremities: Normal range of motion.  Edema: Mild pitting, slight indentation  Mental Status: Normal mood and affect. Normal behavior. Normal judgment and thought content.   Assessment and Plan:  Pregnancy: G3P0020 at [redacted]w[redacted]d 1. [redacted] weeks gestation of pregnancy   2. Supervision of other normal pregnancy, antepartum --Anticipatory guidance about next visits/weeks of pregnancy given. --Waterbirth consent signed and witnessed today, no contraindications to waterbirth at this time. Reviewed contraindications with pt that may occur prior to or during labor including COVID positive. --Next visit in 1 week in office  3. Group B Streptococcus carrier, +RV culture, currently pregnant  4. Chest pain, unspecified type --Heart and lung sounds wnl.  No SOB.  No cardiac history.  Pt with anxiety but feels this is different.  Pt with frequent heartburn so may be esophageal spasms from reflux. --Rx for Protonix to take daily, pt to come to ED if chest pain worsening or is associated with shortness of breath.  5. Decreased fetal movements in third trimester, single or unspecified fetus --Reactive NST with normal fetal movement felt in office today. --Kick counts, reasons to seek care reviewed  Term labor symptoms and general obstetric precautions including but not limited to vaginal bleeding, contractions, leaking of fluid and fetal movement were reviewed in detail with the patient. Please refer to After Visit Summary for other counseling recommendations.   Return in about 1 week (around 03/20/2020).  Future Appointments  Date Time Provider Department Center  03/21/2020 10:30 AM Rasch, Harolyn Rutherford, NP [facility]    Sharen Counter, CNM

## 2020-03-13 NOTE — Patient Instructions (Signed)
Things to Try After 37 weeks to Encourage Labor/Get Ready for Labor:   1.  Try the Miles Circuit at www.milescircuit.com daily to improve baby's position and encourage the onset of labor.  2. Walk a little and rest a little every day.  Change positions often.  3. Cervical Ripening: May try one or both a. Red Raspberry Leaf capsules or tea:  two 300mg or 400mg tablets with each meal, 2-3 times a day, or 1-3 cups of tea daily  Potential Side Effects Of Raspberry Leaf:  Most women do not experience any side effects from drinking raspberry leaf tea. However, nausea and loose stools are possible   b. Evening Primrose Oil capsules: may take 1 to 3 capsules daily. Take 1-2 capsules by mouth each day and place one capsule vaginally at night.  You may also prick the vaginal capsule to release the oil prior to inserting in the vagina. Some of the potential side effects:  Upset stomach  Loose stools or diarrhea  Headaches  Nausea  4. Sex (and especially sex with orgasm) can also help the cervix ripen and encourage labor onset.  Labor Precautions Reasons to come to MAU at [facility]:  1.  Contractions are  5 minutes apart or less, each last 1 minute, these have been going on for 1-2 hours, and you cannot walk or talk during them 2.  You have a large gush of fluid, or a trickle of fluid that will not stop and you have to wear a pad 3.  You have bleeding that is bright red, heavier than spotting--like menstrual bleeding (spotting can be normal in early labor or after a check of your cervix) 4.  You do not feel the baby moving like he/she normally does 

## 2020-03-16 ENCOUNTER — Encounter (HOSPITAL_COMMUNITY): Payer: Self-pay | Admitting: Obstetrics and Gynecology

## 2020-03-16 ENCOUNTER — Other Ambulatory Visit: Payer: Self-pay

## 2020-03-16 ENCOUNTER — Inpatient Hospital Stay (HOSPITAL_COMMUNITY)
Admission: AD | Admit: 2020-03-16 | Discharge: 2020-03-20 | DRG: 806 | Disposition: A | Discharge status: Home / Self Care | Payer: Medicaid Other | Attending: Obstetrics and Gynecology | Admitting: Obstetrics and Gynecology

## 2020-03-16 DIAGNOSIS — D62 Acute posthemorrhagic anemia: Secondary | ICD-10-CM | POA: Diagnosis not present

## 2020-03-16 DIAGNOSIS — Z87891 Personal history of nicotine dependence: Secondary | ICD-10-CM | POA: Diagnosis not present

## 2020-03-16 DIAGNOSIS — F329 Major depressive disorder, single episode, unspecified: Secondary | ICD-10-CM | POA: Diagnosis not present

## 2020-03-16 DIAGNOSIS — Z3A38 38 weeks gestation of pregnancy: Secondary | ICD-10-CM | POA: Diagnosis not present

## 2020-03-16 DIAGNOSIS — O134 Gestational [pregnancy-induced] hypertension without significant proteinuria, complicating childbirth: Secondary | ICD-10-CM | POA: Diagnosis not present

## 2020-03-16 DIAGNOSIS — O99344 Other mental disorders complicating childbirth: Secondary | ICD-10-CM | POA: Diagnosis not present

## 2020-03-16 DIAGNOSIS — O99214 Obesity complicating childbirth: Secondary | ICD-10-CM | POA: Diagnosis not present

## 2020-03-16 DIAGNOSIS — O99893 Other specified diseases and conditions complicating puerperium: Secondary | ICD-10-CM | POA: Diagnosis not present

## 2020-03-16 DIAGNOSIS — O133 Gestational [pregnancy-induced] hypertension without significant proteinuria, third trimester: Secondary | ICD-10-CM | POA: Diagnosis not present

## 2020-03-16 DIAGNOSIS — O139 Gestational [pregnancy-induced] hypertension without significant proteinuria, unspecified trimester: Secondary | ICD-10-CM | POA: Diagnosis present

## 2020-03-16 DIAGNOSIS — O9081 Anemia of the puerperium: Secondary | ICD-10-CM | POA: Diagnosis not present

## 2020-03-16 DIAGNOSIS — O99824 Streptococcus B carrier state complicating childbirth: Secondary | ICD-10-CM | POA: Diagnosis present

## 2020-03-16 DIAGNOSIS — O99213 Obesity complicating pregnancy, third trimester: Secondary | ICD-10-CM | POA: Diagnosis present

## 2020-03-16 DIAGNOSIS — F4310 Post-traumatic stress disorder, unspecified: Secondary | ICD-10-CM | POA: Diagnosis present

## 2020-03-16 DIAGNOSIS — Z3490 Encounter for supervision of normal pregnancy, unspecified, unspecified trimester: Secondary | ICD-10-CM | POA: Diagnosis present

## 2020-03-16 DIAGNOSIS — R32 Unspecified urinary incontinence: Secondary | ICD-10-CM

## 2020-03-16 DIAGNOSIS — Z20822 Contact with and (suspected) exposure to covid-19: Secondary | ICD-10-CM | POA: Diagnosis present

## 2020-03-16 DIAGNOSIS — O99820 Streptococcus B carrier state complicating pregnancy: Secondary | ICD-10-CM

## 2020-03-16 LAB — CBC
HCT: 32.2 % — ABNORMAL LOW (ref 36.0–46.0)
Hemoglobin: 10.2 g/dL — ABNORMAL LOW (ref 12.0–15.0)
MCH: 27.3 pg (ref 26.0–34.0)
MCHC: 31.7 g/dL (ref 30.0–36.0)
MCV: 86.1 fL (ref 80.0–100.0)
Platelets: 263 10*3/uL (ref 150–400)
RBC: 3.74 MIL/uL — ABNORMAL LOW (ref 3.87–5.11)
RDW: 14.5 % (ref 11.5–15.5)
WBC: 11 10*3/uL — ABNORMAL HIGH (ref 4.0–10.5)
nRBC: 0.2 % (ref 0.0–0.2)

## 2020-03-16 LAB — COMPREHENSIVE METABOLIC PANEL
ALT: 11 U/L (ref 0–44)
AST: 15 U/L (ref 15–41)
Albumin: 2.4 g/dL — ABNORMAL LOW (ref 3.5–5.0)
Alkaline Phosphatase: 107 U/L (ref 38–126)
Anion gap: 13 (ref 5–15)
BUN: 12 mg/dL (ref 6–20)
CO2: 19 mmol/L — ABNORMAL LOW (ref 22–32)
Calcium: 9 mg/dL (ref 8.9–10.3)
Chloride: 105 mmol/L (ref 98–111)
Creatinine, Ser: 0.68 mg/dL (ref 0.44–1.00)
GFR calc Af Amer: >60 mL/min (ref >60)
GFR calc non Af Amer: >60 mL/min (ref >60)
Glucose, Bld: 110 mg/dL — ABNORMAL HIGH (ref 70–99)
Potassium: 4.2 mmol/L (ref 3.5–5.1)
Sodium: 137 mmol/L (ref 135–145)
Total Bilirubin: 0.4 mg/dL (ref 0.3–1.2)
Total Protein: 6.3 g/dL — ABNORMAL LOW (ref 6.5–8.1)

## 2020-03-16 LAB — TYPE AND SCREEN
ABO/RH(D): O POS
Antibody Screen: NEGATIVE

## 2020-03-16 LAB — PROTEIN / CREATININE RATIO, URINE
Creatinine, Urine: 138.1 mg/dL
Protein Creatinine Ratio: 0.15 mg/mg{Cre} (ref 0.00–0.15)
Total Protein, Urine: 21 mg/dL

## 2020-03-16 LAB — SARS CORONAVIRUS 2 BY RT PCR (HOSPITAL ORDER, PERFORMED IN ~~LOC~~ HOSPITAL LAB): SARS Coronavirus 2: NEGATIVE

## 2020-03-16 MED ORDER — OXYTOCIN BOLUS FROM INFUSION
333.0000 mL | Freq: Once | INTRAVENOUS | Status: AC
  Administered 2020-03-18: 333 mL via INTRAVENOUS

## 2020-03-16 MED ORDER — PENICILLIN G POT IN DEXTROSE 60000 UNIT/ML IV SOLN
3.0000 10*6.[IU] | INTRAVENOUS | Status: DC
  Administered 2020-03-17 – 2020-03-18 (×8): 3 10*6.[IU] via INTRAVENOUS
  Filled 2020-03-16 (×8): qty 50

## 2020-03-16 MED ORDER — ONDANSETRON HCL 4 MG/2ML IJ SOLN
4.0000 mg | Freq: Four times a day (QID) | INTRAMUSCULAR | Status: DC | PRN
  Administered 2020-03-17 – 2020-03-18 (×3): 4 mg via INTRAVENOUS
  Filled 2020-03-16 (×3): qty 2

## 2020-03-16 MED ORDER — LIDOCAINE HCL (PF) 1 % IJ SOLN
30.0000 mL | INTRAMUSCULAR | Status: DC | PRN
  Filled 2020-03-16: qty 30

## 2020-03-16 MED ORDER — FENTANYL CITRATE (PF) 100 MCG/2ML IJ SOLN
100.0000 ug | INTRAMUSCULAR | Status: DC | PRN
  Administered 2020-03-17 – 2020-03-18 (×15): 100 ug via INTRAVENOUS
  Filled 2020-03-16 (×15): qty 2

## 2020-03-16 MED ORDER — SODIUM CHLORIDE 0.9 % IV SOLN
5.0000 10*6.[IU] | Freq: Once | INTRAVENOUS | Status: AC
  Administered 2020-03-16: 5 10*6.[IU] via INTRAVENOUS
  Filled 2020-03-16: qty 5

## 2020-03-16 MED ORDER — LACTATED RINGERS IV SOLN: INTRAVENOUS | Status: DC

## 2020-03-16 MED ORDER — MISOPROSTOL 25 MCG QUARTER TABLET
25.0000 ug | ORAL_TABLET | ORAL | Status: DC | PRN
  Administered 2020-03-16 – 2020-03-17 (×3): 25 ug via VAGINAL
  Filled 2020-03-16 (×3): qty 1

## 2020-03-16 MED ORDER — ACETAMINOPHEN 325 MG PO TABS: 650.0000 mg | ORAL_TABLET | ORAL | Status: DC | PRN

## 2020-03-16 MED ORDER — OXYTOCIN-SODIUM CHLORIDE 30-0.9 UT/500ML-% IV SOLN
2.5000 [IU]/h | INTRAVENOUS | Status: DC
  Filled 2020-03-16: qty 500

## 2020-03-16 MED ORDER — TERBUTALINE SULFATE 1 MG/ML IJ SOLN: 0.2500 mg | Freq: Once | INTRAMUSCULAR | Status: DC | PRN

## 2020-03-16 MED ORDER — SOD CITRATE-CITRIC ACID 500-334 MG/5ML PO SOLN
30.0000 mL | ORAL | Status: DC | PRN
  Administered 2020-03-17: 30 mL via ORAL
  Filled 2020-03-16 (×2): qty 30

## 2020-03-16 MED ORDER — LACTATED RINGERS IV SOLN
500.0000 mL | INTRAVENOUS | Status: DC | PRN
  Administered 2020-03-17: 500 mL via INTRAVENOUS

## 2020-03-16 NOTE — MAU Provider Note (Addendum)
History     CSN: [id number]  Arrival date and time: 03/16/20 1752   First Provider Initiated Contact with Patient 03/16/20 1856      Chief Complaint  Patient presents with  . Contractions   Casey Wolfe is a 24 y.o. G3P0020 at 17w3dwho presents today with contractions about 7-10 mins apart all day today. She has had some blood show and back pain as well. She has had headaches today. She is also feeling "off today" with nausea, and seeing "flashes of light". She denies any RUQ pain. She reports normal fetal movement.   Hypertension This is a new problem. The current episode started today. The problem is unchanged. Associated agents: pregnancy     OB History    Gravida  3   Para  0   Term      Preterm      AB  2   Living  0     SAB  2   TAB      Ectopic      Multiple      Live Births              Past Medical History:  Diagnosis Date  . Anxiety   . [place] 1 [place]   . Depression   . Hyperemesis affecting pregnancy, antepartum     Past Surgical History:  Procedure Laterality Date  . RHINOPLASTY      Family History  Problem Relation Age of Onset  . COPD Other   . Congestive Heart Failure Other   . Diabetes Other   . Anxiety disorder Mother   . Arthritis Mother   . Heart murmur Sister   . ADD / ADHD Brother   . Heart murmur Brother   . Diabetes Maternal Grandmother   . Hyperlipidemia Maternal Grandmother   . Hypertension Maternal Grandmother   . Aneurysm Maternal Grandmother     Social History   Tobacco Use  . Smoking status: Former Smoker    Packs/day: 0.50    Types: Cigarettes    Quit date: 08/18/2019    Years since quitting: 0.5  . Smokeless tobacco: Never Used  Vaping Use  . Vaping Use: Never used  Substance Use Topics  . Alcohol use: Not Currently    Comment: 2-3 x week  . Drug use: Yes    Types: Marijuana    Comment: this morning    Allergies: No Known Allergies  Medications Prior to Admission  Medication Sig  Dispense Refill Last Dose  . aspirin EC 81 MG tablet Take 1 tablet (81 mg total) by mouth daily. 30 tablet 5 03/16/2020 at Unknown time  . buPROPion (WELLBUTRIN XL) 150 MG 24 hr tablet Take 150 mg by mouth daily.   03/16/2020 at Unknown time  . busPIRone (BUSPAR) 5 MG tablet Take 1 tablet (5 mg total) by mouth 3 (three) times daily. 30 tablet 5 03/16/2020 at Unknown time  . folic acid (FOLVITE) 8081MCG tablet Take 400 mcg by mouth daily.   03/16/2020 at Unknown time  . lamoTRIgine (LAMICTAL) 25 MG tablet Take 50 mg by mouth daily.   03/16/2020 at Unknown time  . pantoprazole (PROTONIX) 40 MG tablet Take 1 tablet (40 mg total) by mouth daily. 30 tablet 0 03/16/2020 at Unknown time  . Prenatal Vit-DSS-Fe Fum-FA (PRENATAL 19) tablet Take by mouth.   03/16/2020 at Unknown time  . sertraline (ZOLOFT) 50 MG tablet Take 50 mg by mouth daily.   03/16/2020 at Unknown  time  . blood glucose meter kit and supplies KIT Dispense based on patient and insurance preference. Use up to four times daily as directed. (FOR ICD-9 250.00, 250.01). 1 each 0     Review of Systems Physical Exam   Blood pressure (!) 156/97, pulse (!) 105, temperature 98.2 F (36.8 C), resp. rate 16, height $RemoveBe'5\' 3"'[id number]$  (1.6 m), weight 114.3 kg, last menstrual period 06/21/2019, SpO2 99 %.  Physical Exam Vitals and nursing note reviewed.  HENT:     Head: Normocephalic.  Cardiovascular:     Rate and Rhythm: Normal rate.  Pulmonary:     Effort: Pulmonary effort is normal.  Skin:    General: Skin is dry.  Neurological:     Mental Status: She is alert and oriented to person, place, and time.  Psychiatric:        Mood and Affect: Mood normal.        Behavior: Behavior normal.    NST:  Baseline: 140 Variability: moderate Accels: 15x15 Decels: none Toco: irregular  Reactive/Appropriate for GA   MAU Course  Procedures  MDM  Labs pending  2007 Care turned over to [facility], DO  [place], CNM  03/16/20  8:07 PM    Care assumed from Marcille Buffy, CNM at 2008 hours  Results for orders placed or performed during the hospital encounter of 03/16/20 (from the past 24 hour(s))  CBC     Status: Abnormal   Collection Time: 03/16/20  7:10 PM  Result Value Ref Range   WBC 11.0 (H) 4.0 - 10.5 K/uL   RBC 3.74 (L) 3.87 - 5.11 MIL/uL   Hemoglobin 10.2 (L) 12.0 - 15.0 g/dL   HCT 32.2 (L) 36 - 46 %   MCV 86.1 80.0 - 100.0 fL   MCH 27.3 26.0 - 34.0 pg   MCHC 31.7 30.0 - 36.0 g/dL   RDW 14.5 11.5 - 15.5 %   Platelets 263 150 - 400 K/uL   nRBC 0.2 0.0 - 0.2 %  Comprehensive metabolic panel     Status: Abnormal   Collection Time: 03/16/20  7:10 PM  Result Value Ref Range   Sodium 137 135 - 145 mmol/L   Potassium 4.2 3.5 - 5.1 mmol/L   Chloride 105 98 - 111 mmol/L   CO2 19 (L) 22 - 32 mmol/L   Glucose, Bld 110 (H) 70 - 99 mg/dL   BUN 12 6 - 20 mg/dL   Creatinine, Ser 0.68 0.44 - 1.00 mg/dL   Calcium 9.0 8.9 - 10.3 mg/dL   Total Protein 6.3 (L) 6.5 - 8.1 g/dL   Albumin 2.4 (L) 3.5 - 5.0 g/dL   AST 15 15 - 41 U/L   ALT 11 0 - 44 U/L   Alkaline Phosphatase 107 38 - 126 U/L   Total Bilirubin 0.4 0.3 - 1.2 mg/dL   GFR calc non Af Amer >60 >60 mL/min   GFR calc Af Amer >60 >60 mL/min   Anion gap 13 5 - 15  Protein / creatinine ratio, urine     Status: None   Collection Time: 03/16/20  7:36 PM  Result Value Ref Range   Creatinine, Urine 138.10 mg/dL   Total Protein, Urine 21 mg/dL   Protein Creatinine Ratio 0.15 0.00 - 0.15 mg/mg[Cre]   Patient has had no symptoms of Pre-E, no visual disturbances since arrival. Still having occasional elevated blood pressures Discussed with Dr. Ilda Basset, most likely new onset GHTN and recommend delivery  NST -baseline:  135 -variability: moderate -accels: 15x15 -decels: none -interpretation: reactive, Cat I  Assessment and Plan  24 yo G3P0020 at 38.3 EGA presenting for labor eval and found to have elevated BP -new onset GHTN, delivery recommended -report called  to Dr. Berniece Andreas for IOL -initially desired waterbirth, however new GTHN is a contraindication, patient understands -Risks and benefits of induction were reviewed, including failure of method, prolonged labor, need for further intervention, risk of cesarean.  Patient and family seem to understand these risks and wish to proceed. Options of cytotec, foley bulb, AROM, and pitocin reviewed, with use of each discussed.  Merilyn Baba, DO OB Fellow, [facility] 03/16/2020 10:13 PM

## 2020-03-16 NOTE — MAU Note (Signed)
.   Casey Wolfe is a 24 y.o. at [redacted]w[redacted]d here in MAU reporting: she has been contracting for 2 days that has gotten worse over the last couple of hours. Reports bloody show. Denies any LOF.Marland Kitchen +FM  Onset of complaint: 2 days Pain score: 8 Vitals:   03/16/20 1820  BP: (!) 152/68  Pulse: (!) 105  Resp: 16  Temp: 98.2 F (36.8 C)  SpO2: 99%     FHT:145 Lab orders placed from triage:

## 2020-03-16 NOTE — H&P (Addendum)
OBSTETRIC ADMISSION HISTORY AND PHYSICAL  Casey Wolfe is a 24 y.o. female G26P0020 with IUP at 65w3dby LMP presenting for IOL for gHTN. She reports +FMs, No LOF, no VB. Endorsing some blurry vision now resolved, chronic headache, and LE edema, no RUQ pain. She plans on breast feeding. She is considering the NVerdenfor birth control. [facility]  She presented to the MAU for a labor check and was noted to have multiple MR blood pressures. She had no severe symptoms or severe-range pressures. She denies change in her chronic headaches, chest pain, SOB, current vision changes.   Dating: By LMP --->  Estimated Date of Delivery: 03/27/20  Sono:    _0 , CWD, normal anatomy, cephalic presentation, posterior placenta, 1876 g, 34%ile EFW   Prenatal History/Complications:  -Obesity BMI 45, on ASA 872m -Anxiety and Depression on zoloft, lamictal, bupropion  -New diagnosis of gHTN as above    Past Medical History: Past Medical History:  Diagnosis Date  . Anxiety   . Bipolar 1 disorder ([place]  . Depression   . Hyperemesis affecting pregnancy, antepartum     Past Surgical History: Past Surgical History:  Procedure Laterality Date  . RHINOPLASTY      Obstetrical History: OB History    Gravida  3   Para  0   Term      Preterm      AB  2   Living  0     SAB  2   TAB      Ectopic      Multiple      Live Births              Social History Social History   Socioeconomic History  . Marital status: Single    Spouse name: Not on file  . Number of children: Not on file  . Years of education: Not on file  . Highest education level: Not on file  Occupational History  . Occupation: manufactoring  Tobacco Use  . Smoking status: Former Smoker    Packs/day: 0.50    Types: Cigarettes    Quit date: 08/18/2019    Years since quitting: 0.5  . Smokeless tobacco: Never Used  Vaping Use  . Vaping Use: Never used  Substance and  Sexual Activity  . Alcohol use: Not Currently    Comment: 2-3 x week  . Drug use: Yes    Types: Marijuana    Comment: this morning  . Sexual activity: Yes    Partners: Male    Birth control/protection: None  Other Topics Concern  . Not on file  Social History Narrative  . Not on file   Social Determinants of Health   Financial Resource Strain:   . Difficulty of Paying Living Expenses: Not on file  Food Insecurity:   . Worried About RuCharity fundraisern the Last Year: Not on file  . Ran Out of Food in the Last Year: Not on file  Transportation Needs:   . Lack of Transportation (Medical): Not on file  . Lack of Transportation (Non-Medical): Not on file  Physical Activity:   . Days of Exercise per Week: Not on file  . Minutes of Exercise per Session: Not on file  Stress:   . Feeling of Stress : Not on file  Social Connections:   . Frequency of Communication with Friends and Family: Not on file  . Frequency of Social Gatherings with Friends  and Family: Not on file  . Attends Religious Services: Not on file  . Active Member of Clubs or Organizations: Not on file  . Attends Archivist Meetings: Not on file  . Marital Status: Not on file    Family History: Family History  Problem Relation Age of Onset  . COPD Other   . Congestive Heart Failure Other   . Diabetes Other   . Anxiety disorder Mother   . Arthritis Mother   . Heart murmur Sister   . ADD / ADHD Brother   . Heart murmur Brother   . Diabetes Maternal Grandmother   . Hyperlipidemia Maternal Grandmother   . Hypertension Maternal Grandmother   . Aneurysm Maternal Grandmother     Allergies: No Known Allergies  Medications Prior to Admission  Medication Sig Dispense Refill Last Dose  . aspirin EC 81 MG tablet Take 1 tablet (81 mg total) by mouth daily. 30 tablet 5 03/16/2020 at Unknown time  . buPROPion (WELLBUTRIN XL) 150 MG 24 hr tablet Take 150 mg by mouth daily.   03/16/2020 at Unknown time  .  busPIRone (BUSPAR) 5 MG tablet Take 1 tablet (5 mg total) by mouth 3 (three) times daily. 30 tablet 5 03/16/2020 at Unknown time  . folic acid (FOLVITE) 485 MCG tablet Take 400 mcg by mouth daily.   03/16/2020 at Unknown time  . lamoTRIgine (LAMICTAL) 25 MG tablet Take 50 mg by mouth daily.   03/16/2020 at Unknown time  . pantoprazole (PROTONIX) 40 MG tablet Take 1 tablet (40 mg total) by mouth daily. 30 tablet 0 03/16/2020 at Unknown time  . Prenatal Vit-DSS-Fe Fum-FA (PRENATAL 19) tablet Take by mouth.   03/16/2020 at Unknown time  . sertraline (ZOLOFT) 50 MG tablet Take 50 mg by mouth daily.   03/16/2020 at Unknown time  . blood glucose meter kit and supplies KIT Dispense based on patient and insurance preference. Use up to four times daily as directed. (FOR ICD-9 250.00, 250.01). 1 each 0      Review of Systems   All systems reviewed and negative except as stated in HPI  Blood pressure 120/69, pulse 97, temperature 98.2 F (36.8 C), resp. rate 16, height _0  (1.6 m), weight 114.3 kg, last menstrual period 06/21/2019, SpO2 99 %. General appearance: alert, cooperative and no distress Lungs: clear to auscultation bilaterally Heart: regular rate and rhythm Abdomen: soft, non-tender; bowel sounds normal Pelvic: 1/thick/high on initial exam Extremities: Nontender, no sign of DVT Psych: anxious affect  DTR's normal Presentation: cephalic Fetal monitoringBaseline: 130 bpm, Variability: Good {> 6 bpm), Accelerations: Reactive and Decelerations: Absent Uterine activityFrequency: Sporadic  Dilation: 1 Effacement (%): Thick Station: Ballotable Exam by:: Dr. Rebekah Chesterfield   Prenatal labs: ABO, Rh: --/--/PENDING (08/20 2203) Antibody: PENDING (08/20 2203) Rubella:   RPR: NON-REACTIVE (08/12 1412)  HBsAg:    HIV: NON-REACTIVE (08/12 1412)  GBS:    2 hr Glucola normal Genetic screening  normal Anatomy US normal  Prenatal Transfer Tool  Maternal Diabetes: No Genetic Screening: Normal Maternal  Ultrasounds/Referrals: Normal Fetal Ultrasounds or other Referrals:  None Maternal Substance Abuse:  No Significant Maternal Medications:  Zoloft, lamictal, buspar Significant Maternal Lab Results: Group B Strep positive  Results for orders placed or performed during the hospital encounter of 03/16/20 (from the past 24 hour(s))  CBC   Collection Time: 03/16/20  7:10 PM  Result Value Ref Range   WBC 11.0 (H) 4.0 - 10.5 K/uL   RBC 3.74 (L) 3.87 -  5.11 MIL/uL   Hemoglobin 10.2 (L) 12.0 - 15.0 g/dL   HCT 32.2 (L) 36 - 46 %   MCV 86.1 80.0 - 100.0 fL   MCH 27.3 26.0 - 34.0 pg   MCHC 31.7 30.0 - 36.0 g/dL   RDW 14.5 11.5 - 15.5 %   Platelets 263 150 - 400 K/uL   nRBC 0.2 0.0 - 0.2 %  Comprehensive metabolic panel   Collection Time: 03/16/20  7:10 PM  Result Value Ref Range   Sodium 137 135 - 145 mmol/L   Potassium 4.2 3.5 - 5.1 mmol/L   Chloride 105 98 - 111 mmol/L   CO2 19 (L) 22 - 32 mmol/L   Glucose, Bld 110 (H) 70 - 99 mg/dL   BUN 12 6 - 20 mg/dL   Creatinine, Ser 0.68 0.44 - 1.00 mg/dL   Calcium 9.0 8.9 - 10.3 mg/dL   Total Protein 6.3 (L) 6.5 - 8.1 g/dL   Albumin 2.4 (L) 3.5 - 5.0 g/dL   AST 15 15 - 41 U/L   ALT 11 0 - 44 U/L   Alkaline Phosphatase 107 38 - 126 U/L   Total Bilirubin 0.4 0.3 - 1.2 mg/dL   GFR calc non Af Amer >60 >60 mL/min   GFR calc Af Amer >60 >60 mL/min   Anion gap 13 5 - 15  Protein / creatinine ratio, urine   Collection Time: 03/16/20  7:36 PM  Result Value Ref Range   Creatinine, Urine 138.10 mg/dL   Total Protein, Urine 21 mg/dL   Protein Creatinine Ratio 0.15 0.00 - 0.15 mg/mg[Cre]  Type and screen [place]   Collection Time: 03/16/20 10:03 PM  Result Value Ref Range   ABO/RH(D) PENDING    Antibody Screen PENDING    Sample Expiration      03/19/2020,2359 Performed at [facility], [address] [place], [place] [address]     Patient Active Problem List   Diagnosis Date Noted  . Encounter for induction of  labor 03/16/2020  . Gestational hypertension affecting first pregnancy 03/16/2020  . Group B Streptococcus carrier, +RV culture, currently pregnant 03/04/2020  . Supervision of other normal pregnancy, antepartum 10/18/2019  . Obesity affecting pregnancy in third trimester 10/18/2019  . Tobacco use disorder 02/03/2019  . Methamphetamine use disorder, severe, in sustained remission ([place]) 02/01/2019  . MDD (major depressive disorder), recurrent severe, without psychosis ([place]) 02/01/2019  . Borderline personality disorder ([place]) 02/01/2019  . Adjustment disorder with mixed anxiety and depressed mood 02/01/2019  . ADHD (attention deficit hyperactivity disorder), combined type 02/01/2019  . Moderate cannabis use disorder ([place]) 02/01/2019  . PTSD (post-traumatic stress disorder) 02/01/2019  . Scoliosis 04/09/2015    Assessment/Plan:  Casey Wolfe is a 24 y.o. G3P0020 at 26w3dhere for IOL for newly diagnosed gHTN.  #Induction of Labor: Will start with vaginal cytotec and continue to augment labor as appropriate.  #gHTN: Diagnosed in MAU, PEC labs normal and UPC. Will continue to monitor closely.   #Depression, Borderline PD: Continue home Zoloft 585mqAM, Lamictal 501mAM, Wellbutrin 150m53mM   #Pain: Epidural prn  #FWB: Cat I  #ID: GBS Pos  #MOF: Breast #MOC: Considering nuvaGalien  03/17/2020, 12:14 AM  I saw and evaluated the patient. I agree with the findings and the plan of care as documented in the resident's note.  JuliSharene Skeans OB FPerry County General Hospitalily Medicine Fellow, [facility], [facility]

## 2020-03-17 ENCOUNTER — Inpatient Hospital Stay (HOSPITAL_COMMUNITY): Payer: Medicaid Other | Admitting: Anesthesiology

## 2020-03-17 ENCOUNTER — Encounter (HOSPITAL_COMMUNITY): Payer: Self-pay | Admitting: Obstetrics and Gynecology

## 2020-03-17 DIAGNOSIS — O164 Unspecified maternal hypertension, complicating childbirth: Secondary | ICD-10-CM | POA: Diagnosis not present

## 2020-03-17 DIAGNOSIS — Z3A38 38 weeks gestation of pregnancy: Secondary | ICD-10-CM | POA: Diagnosis not present

## 2020-03-17 LAB — CBC
HCT: 33.9 % — ABNORMAL LOW (ref 36.0–46.0)
HCT: 33.4 % — ABNORMAL LOW (ref 36.0–46.0)
Hemoglobin: 10.5 g/dL — ABNORMAL LOW (ref 12.0–15.0)
Hemoglobin: 10.7 g/dL — ABNORMAL LOW (ref 12.0–15.0)
MCH: 26.9 pg (ref 26.0–34.0)
MCH: 27.5 pg (ref 26.0–34.0)
MCHC: 31 g/dL (ref 30.0–36.0)
MCHC: 32 g/dL (ref 30.0–36.0)
MCV: 86.7 fL (ref 80.0–100.0)
MCV: 85.9 fL (ref 80.0–100.0)
Platelets: 266 10*3/uL (ref 150–400)
Platelets: 240 10*3/uL (ref 150–400)
RBC: 3.91 MIL/uL (ref 3.87–5.11)
RBC: 3.89 MIL/uL (ref 3.87–5.11)
RDW: 14.6 % (ref 11.5–15.5)
RDW: 14.6 % (ref 11.5–15.5)
WBC: 12.5 10*3/uL — ABNORMAL HIGH (ref 4.0–10.5)
WBC: 13.8 10*3/uL — ABNORMAL HIGH (ref 4.0–10.5)
nRBC: 0 % (ref 0.0–0.2)
nRBC: 0 % (ref 0.0–0.2)

## 2020-03-17 LAB — RPR: RPR Ser Ql: NONREACTIVE

## 2020-03-17 MED ORDER — TERBUTALINE SULFATE 1 MG/ML IJ SOLN: 0.2500 mg | Freq: Once | INTRAMUSCULAR | Status: DC | PRN

## 2020-03-17 MED ORDER — DIPHENHYDRAMINE HCL 50 MG/ML IJ SOLN: 12.5000 mg | INTRAMUSCULAR | Status: DC | PRN

## 2020-03-17 MED ORDER — MISOPROSTOL 25 MCG QUARTER TABLET
ORAL_TABLET | ORAL | Status: AC
  Filled 2020-03-17: qty 1

## 2020-03-17 MED ORDER — LIDOCAINE HCL (PF) 1 % IJ SOLN
INTRAMUSCULAR | Status: DC | PRN
  Administered 2020-03-17: 10 mL via EPIDURAL

## 2020-03-17 MED ORDER — FENTANYL-BUPIVACAINE-NACL 0.5-0.125-0.9 MG/250ML-% EP SOLN
12.0000 mL/h | EPIDURAL | Status: DC | PRN
  Administered 2020-03-18: 12 mL/h via EPIDURAL
  Filled 2020-03-17 (×2): qty 250

## 2020-03-17 MED ORDER — EPHEDRINE 5 MG/ML INJ: 10.0000 mg | INTRAVENOUS | Status: DC | PRN

## 2020-03-17 MED ORDER — MISOPROSTOL 100 MCG PO TABS: 25.0000 ug | ORAL_TABLET | ORAL | Status: DC

## 2020-03-17 MED ORDER — SERTRALINE HCL 50 MG PO TABS
50.0000 mg | ORAL_TABLET | Freq: Every day | ORAL | Status: DC
  Administered 2020-03-17: 50 mg via ORAL
  Filled 2020-03-17 (×3): qty 1

## 2020-03-17 MED ORDER — BUPROPION HCL ER (XL) 150 MG PO TB24
150.0000 mg | ORAL_TABLET | Freq: Every day | ORAL | Status: DC
  Administered 2020-03-17: 150 mg via ORAL
  Filled 2020-03-17 (×3): qty 1

## 2020-03-17 MED ORDER — OXYTOCIN-SODIUM CHLORIDE 30-0.9 UT/500ML-% IV SOLN
1.0000 m[IU]/min | INTRAVENOUS | Status: DC
  Administered 2020-03-17: 2 m[IU]/min via INTRAVENOUS
  Administered 2020-03-17: 6 m[IU]/min via INTRAVENOUS
  Administered 2020-03-18: 14 m[IU]/min via INTRAVENOUS
  Administered 2020-03-18: 6 m[IU]/min via INTRAVENOUS
  Filled 2020-03-17: qty 500

## 2020-03-17 MED ORDER — PHENYLEPHRINE 40 MCG/ML (10ML) SYRINGE FOR IV PUSH (FOR BLOOD PRESSURE SUPPORT)
80.0000 ug | PREFILLED_SYRINGE | INTRAVENOUS | Status: DC | PRN
  Filled 2020-03-17: qty 10

## 2020-03-17 MED ORDER — MISOPROSTOL 50MCG HALF TABLET
50.0000 ug | ORAL_TABLET | ORAL | Status: DC
  Administered 2020-03-17: 50 ug via ORAL

## 2020-03-17 MED ORDER — BUPIVACAINE HCL (PF) 0.75 % IJ SOLN
INTRAMUSCULAR | Status: DC | PRN
Start: 2020-03-17 — End: 2020-03-18
  Administered 2020-03-17: 12 mL/h via EPIDURAL

## 2020-03-17 MED ORDER — PHENYLEPHRINE 40 MCG/ML (10ML) SYRINGE FOR IV PUSH (FOR BLOOD PRESSURE SUPPORT): 80.0000 ug | PREFILLED_SYRINGE | INTRAVENOUS | Status: DC | PRN

## 2020-03-17 MED ORDER — LACTATED RINGERS IV SOLN
500.0000 mL | Freq: Once | INTRAVENOUS | Status: AC
  Administered 2020-03-17: 500 mL via INTRAVENOUS

## 2020-03-17 MED ORDER — LAMOTRIGINE 25 MG PO TABS
50.0000 mg | ORAL_TABLET | Freq: Every day | ORAL | Status: DC
  Administered 2020-03-17: 50 mg via ORAL
  Filled 2020-03-17 (×3): qty 2

## 2020-03-17 MED ORDER — MISOPROSTOL 50MCG HALF TABLET
ORAL_TABLET | ORAL | Status: AC
  Filled 2020-03-17: qty 1

## 2020-03-17 NOTE — Anesthesia Preprocedure Evaluation (Signed)
Anesthesia Evaluation  Patient identified by MRN, date of birth, ID band Patient awake    Reviewed: Allergy & Precautions, H&P , NPO status , Patient's Chart, lab work & pertinent test results  History of Anesthesia Complications Negative for: history of anesthetic complications  Airway Mallampati: II  TM Distance: >3 FB Neck ROM: full    Dental no notable dental hx.    Pulmonary neg pulmonary ROS, former smoker,    Pulmonary exam normal        Cardiovascular hypertension, Normal cardiovascular exam Rhythm:regular Rate:Normal     Neuro/Psych Anxiety Depression Bipolar Disorder negative neurological ROS     GI/Hepatic GERD  ,(+)     substance abuse  marijuana use,   Endo/Other  Morbid obesity  Renal/GU      Musculoskeletal   Abdominal   Peds  Hematology negative hematology ROS (+)   Anesthesia Other Findings  scoliosis  Reproductive/Obstetrics (+) Pregnancy                             Anesthesia Physical Anesthesia Plan  ASA: III  Anesthesia Plan: Epidural   Post-op Pain Management:    Induction:   PONV Risk Score and Plan:   Airway Management Planned:   Additional Equipment:   Intra-op Plan:   Post-operative Plan:   Informed Consent: I have reviewed the patients History and Physical, chart, labs and discussed the procedure including the risks, benefits and alternatives for the proposed anesthesia with the patient or authorized representative who has indicated his/her understanding and acceptance.       Plan Discussed with:   Anesthesia Plan Comments:         Anesthesia Quick Evaluation

## 2020-03-17 NOTE — Anesthesia Procedure Notes (Signed)
Epidural Patient location during procedure: OB Start time: 03/17/2020 10:55 PM End time: 03/17/2020 11:08 PM  Staffing Anesthesiologist: Lucretia Kern, MD Performed: anesthesiologist   Preanesthetic Checklist Completed: patient identified, IV checked, risks and benefits discussed, monitors and equipment checked, pre-op evaluation and timeout performed  Epidural Patient position: sitting Prep: DuraPrep Patient monitoring: heart rate, continuous pulse ox and blood pressure Approach: midline Location: L3-L4 Injection technique: LOR air  Needle:  Needle type: Tuohy  Needle gauge: 17 G Needle length: 9 cm Needle insertion depth: 9 cm Catheter type: closed end flexible Catheter size: 19 Gauge Catheter at skin depth: 14 cm Test dose: negative  Assessment Events: blood not aspirated, injection not painful, no injection resistance, no paresthesia and negative IV test  Additional Notes Reason for block:procedure for pain

## 2020-03-17 NOTE — Progress Notes (Addendum)
Labor Progress Note- Late Entry Casey Wolfe is a 24 y.o. G3P0020 at [redacted]w[redacted]d presented for IOL for gHTN S: Feels pain is adequately controlled with pain medications. Still feeling baby move. Excited to meet her baby.  O:  BP (!) 108/58   Pulse 72   Temp 98 F (36.7 C) (Oral)   Resp 16   Ht 5\' 3"  (1.6 m)   Wt 115.2 kg   LMP 06/21/2019   SpO2 99%   BMI 44.99 kg/m  EFM: baseline 125/moderate variability/+accels, no decels  CVE: Dilation: 1 Effacement (%): Thick Cervical Position: Posterior Station: Ballotable Presentation: Vertex (ultrasound by dr.espinoza) Exam by:: leone   A&P: 24 y.o. 25 [redacted]w[redacted]d IOL for gHTN #Labor: Foley balloon placed at 1030, speculum guided. Cytotec x3, last dose given @1200 . Plan to reassess at 1600. #Pain: per patient request.  #FWB: Cat I #GBS positive on PCN GHTN: BP's stable, most recent 108/58. Continue to monitor pressures hourly.  [redacted]w[redacted]d, DO 1:38 PM  Attestation of Supervision of Student:  I confirm that I have verified the information documented in the  resident's  note and that I have also personally reperformed the history, physical exam and all medical decision making activities.  I have verified that all services and findings are accurately documented in this student's note; and I agree with management and plan as outlined in the documentation. I have also made any necessary editorial changes.  , MD Center for [facility], [facility] 03/17/2020 3:33 PM

## 2020-03-17 NOTE — Progress Notes (Addendum)
Labor Progress Note Casey Wolfe is a 24 y.o. G3P0020 at [redacted]w[redacted]d presented for IOL for gHTN S: Tolerating pain well with IV medications. Does not want an Epidural unless she absolutely needs it because pt desires to deliver on her side or hands and knees and help to catch baby.  O:  BP 119/84   Pulse 74   Temp 98 F (36.7 C) (Oral)   Resp 16   Ht 5\' 3"  (1.6 m)   Wt 115.2 kg   LMP 06/21/2019   SpO2 99%   BMI 44.99 kg/m  EFM: baseline 130/moderate variability/+accels, no decels  CVE: Dilation: 4 Effacement (%): 50 Cervical Position: Posterior Station: -3 Presentation: Vertex Exam by:: lee   A&P: 24 y.o. 25 [redacted]w[redacted]d IOL for gHTN #Labor: Foley out. Started Pitocin @1405  at 10mL/hr. Plan to increase 60mL every 2 hours.  #Pain: Receiving IV fentanyl every hour. Would like to hold off on Epidural if possible as she desires to be active and help to catch her baby #FWB: Cat I #GBS positive on PCN #gHTN: BP's stable, most recent 131/78. Continue to monitor vitals routinely   3m, DO 7:35 PM  Attestation of Supervision of Student:  I confirm that I have verified the information documented in the  resident's  note and that I have also personally reperformed the history, physical exam and all medical decision making activities.  I have verified that all services and findings are accurately documented in this student's note; and I agree with management and plan as outlined in the documentation. I have also made any necessary editorial changes.  3m, MD Center for [facility], [facility] 03/17/2020 9:00 PM

## 2020-03-17 NOTE — Progress Notes (Signed)
Labor Progress Note Casey Wolfe is a 24 y.o. G3P0020 at [redacted]w[redacted]d who presented for IOL for gHTN.   S: Doing well, some back pain improved with IV fentanyl.  O:  BP 122/63   Pulse 80   Temp 98 F (36.7 C) (Oral)   Resp 16   Ht 5\' 3"  (1.6 m)   Wt 115.2 kg   LMP 06/21/2019   SpO2 99%   BMI 44.99 kg/m  EFM: 130/mod variability/+ accels/no decels  CVE: Dilation: 1 Effacement (%): Thick Cervical Position: Posterior Station: Ballotable Presentation: Vertex Exam by:: Farooq Petrovich   A&P: 24 y.o. 25 [redacted]w[redacted]d presenting for IOL for gHTN. #Labor: S/p Cytotec x 2, now 1cm dilated but remains very posterior. Attempt to place Foley bulb was not successful. Gave additional dose of vaginal Cytotec and will reattempt Foley bulb with speculum visualization at next check. #Pain: Per patient request, receiving IV pain meds #FWB: Cat I #GBS positive, receiving penicillin #gHTN:Diagnosed in MAU, PEC labs normal and UPC. Had several additional mild range BP 140s/90s on arrival to labor floor but now well-controlled 110s/80s. Will continue to monitor closely. #Depression, Borderline PD: Continue home Zoloft 50mg  qAM, Lamictal 50mg  qAM, Wellbutrin 150mg  qAM.  [redacted]w[redacted]d, MD 9:31 AM

## 2020-03-17 NOTE — Progress Notes (Signed)
Labor Progress Note Casey Wolfe is a 24 y.o. G3P0020 at [redacted]w[redacted]d who presented for IOL for gHTN.  S: Doing well overall, has been feeling pressure in her back and increased pain with contractions. Requiring IV pain medication about every 90 minutes.  O:  BP 119/70   Pulse 77   Temp 97.8 F (36.6 C) (Oral)   Resp 18   Ht 5\' 3"  (1.6 m)   Wt 115.2 kg   LMP 06/21/2019   SpO2 99%   BMI 44.99 kg/m  EFM: 140/mod variability/+ accels/no decels  CVE: Dilation: 4.5 Effacement (%): 50 Cervical Position: Posterior Station: -3 Presentation: Vertex Exam by:: Dr. [email or web address]  Long discussion regarding pain control plan with patient given that she is requiring IV fentanyl every 90 minutes and still reporting breakthrough pain, concerned whether this will be a sustainable form of pain control once she enters active labor. Patient initially declined epidural due to desire to deliver on her side or hands and knees position. Clarified with patient that movement would be more limited but position changes would still be possible with epidural in place. She is now interested in getting an epidural placed later in her labor course.  A&P: 24 y.o. 25 [redacted]w[redacted]d presenting for IOL for gHTN.  #Induction of Labor: Progressing well, now s/p Cytotec x 4 and FB. Slight cervical change but remains significantly posterior. FB out 1543, Pitocin at 8. Will try position change to encourage fetal descent and continue to increase Pitocin rate as tolerated.   #gHTN:Diagnosed in MAU, PEC labs normal and UPC 0.15. Asymptomatic. Last mild-range pressure 149/86 at 1731, now in 120s/70s range. Will continue to monitor closely.  #Depression, Borderline PD: Reporting some anxiety but stable mood overall. Continue home Zoloft 50mg  qAM, Lamictal 50mg  qAM, Wellbutrin 150mg  qAM.  #Pain: Receiving IV fentanyl about every 90 minutes with some breakthrough pain. See above for discussion of pain control plan, patient now planning to request  an epidural once in active labor. #FWB: Cat I #GBS positive, receiving penicillin    [redacted]w[redacted]d, MD 9:31 PM

## 2020-03-17 NOTE — Progress Notes (Signed)
Labor Progress Note Samiyah Stupka is a 24 y.o. G3P0020 at [redacted]w[redacted]d presented for IOL for gHTN.   S: Doing well, no complaints. Not feeling contractions but having some pain in her lower back.  O:  BP 133/78    Pulse 83    Temp 98 F (36.7 C) (Oral)    Resp 18    Ht 5\' 3"  (1.6 m)    Wt 115.2 kg    LMP 06/21/2019    SpO2 99%    BMI 44.99 kg/m  EFM: 140/mod variability/+ accels/no decels  CVE: Dilation: Fingertip Effacement (%): Thick Cervical Position: Posterior Station: Ballotable Presentation: Vertex Exam by:: B McClam, RN & Dr. [email or web address]    A&P: 24 y.o. 25 [redacted]w[redacted]d presenting for IOL for gHTN. #Labor: Cervix remains notably posterior, previously called 1cm but internal os feels closed on last check. S/p Cytotec x 1, will place an additional vaginal cytotec and recheck patient in 4 hours to determine if a Foley bulb can be placed at that time. #Pain: Per patient request, receiving IV pain medication #FWB: Cat I #GBS positive, receiving penicillin #gHTN: Diagnosed in MAU, PEC labs normal and UPC. Has had several additional mild range BP 140s/90s. Will continue to monitor closely.  #Depression, Borderline PD: Continue home Zoloft 50mg  qAM, Lamictal 50mg  qAM, Wellbutrin 150mg  qAM.   [redacted]w[redacted]d, MD 4:05 AM

## 2020-03-18 ENCOUNTER — Encounter (HOSPITAL_COMMUNITY): Payer: Self-pay | Admitting: Obstetrics and Gynecology

## 2020-03-18 DIAGNOSIS — Z3A38 38 weeks gestation of pregnancy: Secondary | ICD-10-CM | POA: Diagnosis not present

## 2020-03-18 DIAGNOSIS — O133 Gestational [pregnancy-induced] hypertension without significant proteinuria, third trimester: Secondary | ICD-10-CM | POA: Diagnosis not present

## 2020-03-18 DIAGNOSIS — O139 Gestational [pregnancy-induced] hypertension without significant proteinuria, unspecified trimester: Secondary | ICD-10-CM | POA: Diagnosis not present

## 2020-03-18 LAB — CBC
HCT: 34.9 % — ABNORMAL LOW (ref 36.0–46.0)
HCT: 32.5 % — ABNORMAL LOW (ref 36.0–46.0)
Hemoglobin: 10.7 g/dL — ABNORMAL LOW (ref 12.0–15.0)
Hemoglobin: 10.4 g/dL — ABNORMAL LOW (ref 12.0–15.0)
MCH: 26.9 pg (ref 26.0–34.0)
MCH: 27.7 pg (ref 26.0–34.0)
MCHC: 30.7 g/dL (ref 30.0–36.0)
MCHC: 32 g/dL (ref 30.0–36.0)
MCV: 87.7 fL (ref 80.0–100.0)
MCV: 86.4 fL (ref 80.0–100.0)
Platelets: 248 10*3/uL (ref 150–400)
Platelets: 267 10*3/uL (ref 150–400)
RBC: 3.98 MIL/uL (ref 3.87–5.11)
RBC: 3.76 MIL/uL — ABNORMAL LOW (ref 3.87–5.11)
RDW: 14.6 % (ref 11.5–15.5)
RDW: 14.6 % (ref 11.5–15.5)
WBC: 13.4 10*3/uL — ABNORMAL HIGH (ref 4.0–10.5)
WBC: 19 10*3/uL — ABNORMAL HIGH (ref 4.0–10.5)
nRBC: 0 % (ref 0.0–0.2)
nRBC: 0 % (ref 0.0–0.2)

## 2020-03-18 LAB — COMPREHENSIVE METABOLIC PANEL
ALT: 11 U/L (ref 0–44)
AST: 19 U/L (ref 15–41)
Albumin: 2.3 g/dL — ABNORMAL LOW (ref 3.5–5.0)
Alkaline Phosphatase: 114 U/L (ref 38–126)
Anion gap: 11 (ref 5–15)
BUN: 13 mg/dL (ref 6–20)
CO2: 22 mmol/L (ref 22–32)
Calcium: 8.6 mg/dL — ABNORMAL LOW (ref 8.9–10.3)
Chloride: 102 mmol/L (ref 98–111)
Creatinine, Ser: 0.94 mg/dL (ref 0.44–1.00)
GFR calc Af Amer: >60 mL/min (ref >60)
GFR calc non Af Amer: >60 mL/min (ref >60)
Glucose, Bld: 95 mg/dL (ref 70–99)
Potassium: 4.1 mmol/L (ref 3.5–5.1)
Sodium: 135 mmol/L (ref 135–145)
Total Bilirubin: 0.7 mg/dL (ref 0.3–1.2)
Total Protein: 6.2 g/dL — ABNORMAL LOW (ref 6.5–8.1)

## 2020-03-18 MED ORDER — ONDANSETRON HCL 4 MG PO TABS: 4.0000 mg | ORAL_TABLET | ORAL | Status: DC | PRN

## 2020-03-18 MED ORDER — ACETAMINOPHEN 325 MG PO TABS
650.0000 mg | ORAL_TABLET | ORAL | Status: DC | PRN
  Administered 2020-03-20: 650 mg via ORAL

## 2020-03-18 MED ORDER — TETANUS-DIPHTH-ACELL PERTUSSIS 5-2.5-18.5 LF-MCG/0.5 IM SUSP: 0.5000 mL | Freq: Once | INTRAMUSCULAR | Status: DC

## 2020-03-18 MED ORDER — SIMETHICONE 80 MG PO CHEW: 80.0000 mg | CHEWABLE_TABLET | ORAL | Status: DC | PRN

## 2020-03-18 MED ORDER — BENZOCAINE-MENTHOL 20-0.5 % EX AERO
1.0000 "application " | INHALATION_SPRAY | CUTANEOUS | Status: DC | PRN
  Filled 2020-03-18: qty 56

## 2020-03-18 MED ORDER — SERTRALINE HCL 50 MG PO TABS
50.0000 mg | ORAL_TABLET | Freq: Every day | ORAL | Status: DC
  Administered 2020-03-19 – 2020-03-20 (×2): 50 mg via ORAL
  Filled 2020-03-18 (×2): qty 1

## 2020-03-18 MED ORDER — SENNOSIDES-DOCUSATE SODIUM 8.6-50 MG PO TABS
2.0000 | ORAL_TABLET | ORAL | Status: DC
  Administered 2020-03-19 – 2020-03-20 (×2): 2 via ORAL
  Filled 2020-03-18 (×2): qty 2

## 2020-03-18 MED ORDER — WITCH HAZEL-GLYCERIN EX PADS: 1.0000 "application " | MEDICATED_PAD | CUTANEOUS | Status: DC | PRN

## 2020-03-18 MED ORDER — IBUPROFEN 600 MG PO TABS
600.0000 mg | ORAL_TABLET | Freq: Four times a day (QID) | ORAL | Status: DC
  Administered 2020-03-19 – 2020-03-20 (×7): 600 mg via ORAL
  Filled 2020-03-18 (×7): qty 1

## 2020-03-18 MED ORDER — COCONUT OIL OIL: 1.0000 "application " | TOPICAL_OIL | Status: DC | PRN

## 2020-03-18 MED ORDER — DIPHENHYDRAMINE HCL 25 MG PO CAPS: 25.0000 mg | ORAL_CAPSULE | Freq: Four times a day (QID) | ORAL | Status: DC | PRN

## 2020-03-18 MED ORDER — DIBUCAINE (PERIANAL) 1 % EX OINT: 1.0000 "application " | TOPICAL_OINTMENT | CUTANEOUS | Status: DC | PRN

## 2020-03-18 MED ORDER — ONDANSETRON HCL 4 MG/2ML IJ SOLN: 4.0000 mg | INTRAMUSCULAR | Status: DC | PRN

## 2020-03-18 MED ORDER — PRENATAL MULTIVITAMIN CH
1.0000 | ORAL_TABLET | Freq: Every day | ORAL | Status: DC
  Administered 2020-03-19 – 2020-03-20 (×2): 1 via ORAL
  Filled 2020-03-18 (×2): qty 1

## 2020-03-18 MED ORDER — ZOLPIDEM TARTRATE 5 MG PO TABS: 5.0000 mg | ORAL_TABLET | Freq: Every evening | ORAL | Status: DC | PRN

## 2020-03-18 NOTE — Progress Notes (Signed)
Labor Progress Note Ruhi Kopke is a 24 y.o. G3P0020 at [redacted]w[redacted]d presented for IOL for gHTN  S:  Patient reporting pressure and bloody show. Breathing through contractions  O:  BP 133/86   Pulse 80   Temp 98.3 F (36.8 C) (Oral)   Resp 16   Ht 5\' 3"  (1.6 m)   Wt 115.2 kg   LMP 06/21/2019   SpO2 100%   BMI 44.99 kg/m   Fetal Tracing:  Baseline: 140 Variability: moderate Accels: 15x15 Decels: none  Toco: 3-4   CVE: Dilation: 6 Effacement (%): 100 Cervical Position: Middle Station: 0 Presentation: Vertex Exam by:: Lorain Fettes,cnm   A&P: 24 y.o. G3P0020 [redacted]w[redacted]d IOL gHTN #Labor: Cervix unchanged since midnight. Discussed risks and benefits of IUPC placement for monitoring contractions and titrate pitocin more accurately. Patient agreeable to plan of care. Internal monitor placed without difficulty and patient tolerated procedure well.   Patient denies HA, visual changes or epigastric pain. Will repeat labs.   #Pain: epidural #FWB: Cat 1 #GBS positive   [redacted]w[redacted]d, CNM 11:31 AM

## 2020-03-18 NOTE — Progress Notes (Signed)
Labor Progress Note Casey Wolfe is a 24 y.o. G3P0020 at [redacted]w[redacted]d who presented for IOL for gHTN.  S: Doing well, feeling some nausea and asking for Zofran.  O:  BP 127/73   Pulse 79   Temp 98.2 F (36.8 C) (Oral)   Resp 16   Ht 5\' 3"  (1.6 m)   Wt 115.2 kg   LMP 06/21/2019   SpO2 100%   BMI 44.99 kg/m  EFM: 130/mod variability/+ accels/no decels  CVE: Dilation: 6 Effacement (%): 90 Cervical Position: Middle Station: -1 Presentation: Vertex Exam by:: Dr. [email or web address]   A&P: 24 y.o. 25 [redacted]w[redacted]d presenting for IOL for gHTN. #Induction of Labor: Progressing well, making cervical change. Pitocin currently off, contracting every 3-5 minutes. AROM at 0502 with clear fluid. Will monitor FHT for 30 minutes following AROM and then restart pitocin at that time if tolerated.  #gHTN:Diagnosed in MAU, PEC labs normal and UPC0.15.Asymptomatic. Last mild-range pressure 149/86 at 1731, now in 120s/70s range.Will continue to monitor closely.  #Depression, Borderline [redacted]w[redacted]d some anxiety but stable mood overall.Continue home Zoloft 50mg  qAM, Lamictal 50mg  qAM, Wellbutrin 150mg  qAM.  #Pain: Epidural #FWB: Cat I #GBS positive, getting penicillin   HL:[id number], MD 5:15 AM

## 2020-03-18 NOTE — Progress Notes (Signed)
Dr. Jolayne Panther consulted regarding deep variables pushing and minimal movement of fetal station while pushing. Recommends 30 minute break in pushing to allow FHR to recover and restarting pushing.   Discussed recommendations with patient. Patient agreeable to rest but immediately tearful stating that she does not want a cesarean. CNM verbalized understanding of fears related to delivery but emphasized importance of maintaining safety of infant. Discussed reasons patient would need cesarean including FHR not tolerating labor.   Rolm Bookbinder, CNM 03/18/20

## 2020-03-18 NOTE — Progress Notes (Signed)
Labor Progress Note Casey Wolfe is a 24 y.o. G3P0020 at [redacted]w[redacted]d presented for IOL for gHTN.  S: Doing well, pain much better controlled with epidural in place.  O:  BP 135/79   Pulse 76   Temp 97.8 F (36.6 C) (Oral)   Resp 18   Ht 5\' 3"  (1.6 m)   Wt 115.2 kg   LMP 06/21/2019   SpO2 100%   BMI 44.99 kg/m  EFM: 150/mod variability/+ accels/variable decels with 3 late decels  CVE: Dilation: 5.5 Effacement (%): 80 Cervical Position: Middle Station: -1 Presentation: Vertex Exam by:: [email or web address] [place] RN   A&P: 24 y.o. 25 [redacted]w[redacted]d presenting for IOL for gHTN. #Labor: Progressing well. Epidural placed 2311. Pit previously at 8, held at 2358 after several late decels in setting of rapid fetal descent from -3 to -1. Will monitor FHT for 1 hour and restart pitocin at that time if no further decels.  #gHTN:Diagnosed in MAU, PEC labs normal and UPC 0.15. Asymptomatic. Last mild-range pressure 149/86 at 1731, now in 120s-130s/70s range. Will continue to monitor closely.  #Depression, Borderline PD: Reporting some anxiety but stable mood overall. Continue home Zoloft 50mg  qAM, Lamictal 50mg  qAM, Wellbutrin 150mg  qAM.  #Pain: Epidural #FWB: Cat II strip for variable decels and several lates occurring in setting of rapid fetal descent, reassuring due to good variability and return to baseline. Pitocin turned off with return to Cat I strip, will continue to monitor. #GBS positive, receiving penicillin    2359, MD 12:24 AM

## 2020-03-18 NOTE — Progress Notes (Signed)
Labor Progress Note Casey Wolfe is a 24 y.o. G3P0020 at 110w5d presented for IOL 2/2 gHTN S: Frequently feeling the urge to have bowel movement  O:  BP 120/87   Pulse 82   Temp 98.2 F (36.8 C) (Axillary)   Resp 18   Ht 5\' 3"  (1.6 m)   Wt 115.2 kg   LMP 06/21/2019   SpO2 100%   BMI 44.99 kg/m  EFM: baseline 150/moderate variability/+accels, deep variable decels (2.5 min and 1.5 min)  CVE: Dilation: 10 Dilation Complete Date: 03/18/20 Dilation Complete Time: 1455 Effacement (%): 100 Cervical Position: Middle Station: Plus 2 Presentation: Vertex Exam by:: lee   A&P: 24 y.o. 25 [redacted]w[redacted]d IOL for gHTN #Labor: Has resumed pushing #Pain: Epidural #FWB: Cat II. Responsive to scalp stimulation. Reassuring due to moderate variability and +accels. Will continue to monitor closely. #GBS positive on PCN #gHTN: stable BP's,  120/87 most recent @1630   [redacted]w[redacted]d, DO 5:07 PM

## 2020-03-18 NOTE — Progress Notes (Signed)
Labor Progress Note Casey Wolfe is a 24 y.o. G3P0020 at [redacted]w[redacted]d presented for IOL gHTN  S:  Patient feeling uncontrollable urge to push. Pushed x1 hour now   O:  BP 128/67   Pulse 79   Temp 98.2 F (36.8 C) (Axillary)   Resp 18   Ht 5\' 3"  (1.6 m)   Wt 115.2 kg   LMP 06/21/2019   SpO2 100%   BMI 44.99 kg/m   Fetal Tracing:  Baseline: 150 Variability: moderate Accels: 10x10 Decels: varibles  Toco: 2-5   CVE: Dilation: 10 Dilation Complete Date: 03/18/20 Dilation Complete Time: 1455 Effacement (%): 100 Cervical Position: Middle Station: Plus 2 Presentation: Vertex Exam by:: lee   A&P: 24 y.o. G3P0020 [redacted]w[redacted]d IOL gHTN #Labor:Will labor down x1 hour and restart pushing #Pain: epidural #FWB: Cat 1 #GBS positive   [redacted]w[redacted]d, CNM 4:21 PM

## 2020-03-18 NOTE — Discharge Summary (Signed)
Postpartum Discharge Summary    Patient Name: Casey Wolfe DOB: 25-May-1996 MRN: [id number]  Date of admission: 03/16/2020 Delivery date:03/18/2020  Delivering provider: Wende Mott  Date of discharge: 03/20/2020  Admitting diagnosis: Encounter for induction of labor [Z34.90] Intrauterine pregnancy: [redacted]w[redacted]d    Secondary diagnosis:  Principal Problem:   Encounter for induction of labor Active Problems:   PTSD (post-traumatic stress disorder)   Obesity affecting pregnancy in third trimester   Group B Streptococcus carrier, +RV culture, currently pregnant   Gestational hypertension affecting first pregnancy  Additional problems: None    Discharge diagnosis: Term Pregnancy Delivered and Gestational Hypertension                                              Post partum procedures: None Augmentation: AROM, Pitocin, Cytotec and IP Foley Complications: None  Hospital course: Induction of Labor With Vaginal Delivery   24y.o. yo G3P0020 at 382w5das admitted to the hospital 03/16/2020 for induction of labor.  Indication for induction: Gestational hypertension.  Patient had an uncomplicated labor course as follows: Membrane Rupture Time/Date: 5:02 AM ,03/18/2020   Delivery Method:Vaginal, Spontaneous  Episiotomy: None  Lacerations:  2nd degree  Details of delivery can be found in separate delivery note.  Patient had a routine postpartum course. Patient is discharged home 03/20/20.  Newborn Data: Birth date:03/18/2020  Birth time:7:27 PM  Gender:Female  Living status:Living  Apgars:3 ,8  Weight:2980 g   Magnesium Sulfate received: No BMZ received: No Rhophylac:N/A MMR:N/A T-DaP:Given prenatally Flu: N/A Transfusion:No  Physical exam  Vitals:   03/18/20 1734 03/18/20 1800 03/18/20 1831 03/18/20 1900  BP:  133/90 (!) 136/42   Pulse:  81 96   Resp: 18     Temp: 98.5 F (36.9 C)   98.8 F (37.1 C)  TempSrc: Oral   Oral  SpO2:      Weight:      Height:       General:  alert, cooperative and no distress Lochia: appropriate Uterine Fundus: firm Incision: N/A DVT Evaluation: No evidence of DVT seen on physical exam. Labs: Lab Results  Component Value Date   WBC 13.4 (H) 03/18/2020   HGB 10.7 (L) 03/18/2020   HCT 34.9 (L) 03/18/2020   MCV 87.7 03/18/2020   PLT 248 03/18/2020   CMP Latest Ref Rng & Units 03/18/2020  Glucose 70 - 99 mg/dL 95  BUN 6 - 20 mg/dL 13  Creatinine 0.44 - 1.00 mg/dL 0.94  Sodium 135 - 145 mmol/L 135  Potassium 3.5 - 5.1 mmol/L 4.1  Chloride 98 - 111 mmol/L 102  CO2 22 - 32 mmol/L 22  Calcium 8.9 - 10.3 mg/dL 8.6(L)  Total Protein 6.5 - 8.1 g/dL 6.2(L)  Total Bilirubin 0.3 - 1.2 mg/dL 0.7  Alkaline Phos 38 - 126 U/L 114  AST 15 - 41 U/L 19  ALT 0 - 44 U/L 11   Edinburgh Score: Edinburgh Postnatal Depression Scale Screening Tool 03/19/2020  I have been able to laugh and see the funny side of things. (No Data)    After visit meds:  Allergies as of 03/20/2020   No Known Allergies     Medication List    STOP taking these medications   aspirin EC 81 MG tablet   blood glucose meter kit and supplies Kit   folic acid 80144CG  tablet Commonly known as: FOLVITE   pantoprazole 40 MG tablet Commonly known as: Protonix     TAKE these medications   acetaminophen 325 MG tablet Commonly known as: Tylenol Take 2 tablets (650 mg total) by mouth every 6 (six) hours as needed (for pain scale < 4).   ascorbic acid 250 MG tablet Commonly known as: VITAMIN C Take 1 tablet (250 mg total) by mouth every other day. Take together with iron pills. Start taking on: March 21, 2020   buPROPion 150 MG 24 hr tablet Commonly known as: WELLBUTRIN XL Take 150 mg by mouth daily.   busPIRone 5 MG tablet Commonly known as: BUSPAR Take 1 tablet (5 mg total) by mouth 3 (three) times daily. What changed:   when to take this  reasons to take this   ferrous sulfate 325 (65 FE) MG tablet Take 1 tablet (325 mg total) by mouth every  other day. Start taking on: March 21, 2020   ibuprofen 600 MG tablet Commonly known as: ADVIL Take 1 tablet (600 mg total) by mouth every 6 (six) hours.   LaMICtal 25 MG tablet Generic drug: lamoTRIgine Take 50 mg by mouth daily.   NIFEdipine 30 MG 24 hr tablet Commonly known as: PROCARDIA-XL/NIFEDICAL-XL Take 1 tablet (30 mg total) by mouth daily.   Prenatal 19 tablet Take by mouth.   sertraline 50 MG tablet Commonly known as: ZOLOFT Take 50 mg by mouth daily.       Discharge home in stable condition Infant Feeding: Breast Infant Disposition:home with mother Discharge instruction: per After Visit Summary and Postpartum booklet. Activity: Advance as tolerated. Pelvic rest for 6 weeks.  Diet: routine diet Future Appointments  Date Time Provider [place]  03/21/2020 10:30 AM Rasch, Artist Pais, NP [facility]   Follow up Visit:  Please schedule this patient for a In person postpartum visit in 4 weeks with the following provider: Any provider. Additional Postpartum F/U:Postpartum Depression checkup and BP check 1 week  Low risk pregnancy complicated by: HTN Delivery mode:  Vaginal, Spontaneous  Anticipated Birth Control:  nuvaring   gHTN: Diagnosed in MAU at time of admission, PEC labs normal and UPC0.15.Asymptomatic. Mild range BP 140/94 postpartum, otherwise in 120s/70s range. Will discharge on Procardia XL 20m daily and schedule for 1 week blood pressure check. Anemia: Hgb 10.4 --> 8.7. Asymptomatic. Started on FeSO4 3293mq48h postpartum, will continue at discharge. Take concurrently with Vitamin C. Depression, Borderline PDFO:YDXAJOINOtable mood.Continue home Zoloft 5060mAM, Lamictal 31m103mM, Wellbutrin 131mg47m, Buspar PRN. Social work consulted, no barriers to discharge Urinary Incontinence: Patient reporting urinary incontinence immediately postpartum, improving at time of discharge. Referred for outpatient pelvic floor  PT.  03/20/2020 VirgiMaureen Ralphs

## 2020-03-18 NOTE — Progress Notes (Signed)
Labor Progress Note Casey Wolfe is a 24 y.o. G3P0020 at [redacted]w[redacted]d who presented for IOL for gHTN.  S: Doing well, feeling increasing pelvic pressure and urge to push.  O:  BP 136/90   Pulse 76   Temp 98.2 F (36.8 C) (Oral)   Resp 16   Ht 5\' 3"  (1.6 m)   Wt 115.2 kg   LMP 06/21/2019   SpO2 100%   BMI 44.99 kg/m  EFM: 125/mod variability/+ accels/no decels  CVE: Dilation: 7.5 Effacement (%): 100 Cervical Position: Middle Station: 0 Presentation: Vertex Exam by:: [email or web address]   A&P: 24 y.o. 25 [redacted]w[redacted]d presenting for IOL for gHTN. #Induction of Labor: Progressing well, making appropriate cervical change. Pit at 4, plan to continue to increase pitocin as tolerated and recheck in 2 hours or with clinical change.  #gHTN:Diagnosed in MAU, PEC labs normal and UPC0.15.Asymptomatic. Last mild-range pressure 142/83 at 0800, but mostly in 110s-120s/70s-80s range.Will continue to monitor closely.  #Depression, Borderline [redacted]w[redacted]d some anxiety but stable mood overall.Continue home Zoloft 50mg  qAM, Lamictal 50mg  qAM, Wellbutrin 150mg  qAM.  #Pain: Epidural #FWB: Cat I #GBS positive, getting penicillin    AV:[id number], MD 7:54 AM

## 2020-03-19 LAB — CBC
HCT: 27.2 % — ABNORMAL LOW (ref 36.0–46.0)
Hemoglobin: 8.7 g/dL — ABNORMAL LOW (ref 12.0–15.0)
MCH: 27.5 pg (ref 26.0–34.0)
MCHC: 32 g/dL (ref 30.0–36.0)
MCV: 86.1 fL (ref 80.0–100.0)
Platelets: 207 10*3/uL (ref 150–400)
RBC: 3.16 MIL/uL — ABNORMAL LOW (ref 3.87–5.11)
RDW: 14.6 % (ref 11.5–15.5)
WBC: 14.5 10*3/uL — ABNORMAL HIGH (ref 4.0–10.5)
nRBC: 0 % (ref 0.0–0.2)

## 2020-03-19 MED ORDER — VITAMIN C 250 MG PO TABS: 250.0000 mg | ORAL_TABLET | ORAL | Status: DC

## 2020-03-19 MED ORDER — VITAMIN C 500 MG/5ML PO SYRP: 250.0000 mg | ORAL_SOLUTION | ORAL | Status: DC

## 2020-03-19 MED ORDER — FERROUS SULFATE 325 (65 FE) MG PO TABS
325.0000 mg | ORAL_TABLET | ORAL | Status: DC
  Administered 2020-03-19: 325 mg via ORAL
  Filled 2020-03-19: qty 1

## 2020-03-19 NOTE — Progress Notes (Addendum)
POSTPARTUM PROGRESS NOTE  Subjective: Casey Wolfe is a 24 y.o. [id number] PPD#1 s/p SVD at [redacted]w[redacted]d.  She reports she doing well. Overnight had episodes of incontinence that have continued this morning, reports leakage of urine with standing or position change. She denies any problems with ambulating or po intake. Denies nausea or vomiting. She has passed flatus. Pain is well controlled.  Lochia appropriate.  Objective: Blood pressure 127/62, pulse 80, temperature 98.6 F (37 C), temperature source Oral, resp. rate 18, height 5\' 3"  (1.6 m), weight 115.2 kg, last menstrual period 06/21/2019, SpO2 99 %, unknown if currently breastfeeding.  Physical Exam:  General: alert, cooperative and no distress Chest: no respiratory distress Abdomen: soft, non-tender  Uterine Fundus: firm, appropriately tender Extremities: No calf swelling or tenderness  trace bilateral LE edema  Recent Labs    03/18/20 2225 03/19/20 0512  HGB 10.4* 8.7*  HCT 32.5* 27.2*    Assessment/Plan: Casey Wolfe is a 24 y.o. 25 PPD#1 s/p SVD at [redacted]w[redacted]d after IOL for gHTN.  Routine Postpartum Care: Doing well, pain well-controlled.  -- Continue routine care, lactation support.  -- Contraception: Nuvaring -- Feeding: Breast -- gHTN: Diagnosed in MAU at time of admission, PEC labs normal and UPC0.15.Asymptomatic. BP range 120s/60s-70s, last mild range pressure 140/94 at 0010. Will continue to monitor. -- Anemia: Hgb 10.4 --> 8.7. Asymptomatic. Continue FeSO4 325mg  q48h. -- Depression, Borderline [redacted]w[redacted]d stable mood.Continue home Zoloft 50mg  qAM, Lamictal 50mg  qAM, Wellbutrin 150mg  qAM. Social work consulted, no barriers to discharge -- Urinary Incontinence: Patient reporting incontinence overnight. Reportedly delivered with Foley catheter in place and foley dislodged without having been deflated. Will continue to monitor symptoms throughout the day.  Dispo: Plan for discharge tomorrow.  , MD PGY-3 Family  Medicine Resident  GME ATTESTATION:  I saw and evaluated the patient. I agree with the findings and the plan of care as documented in the resident's note.  QT:[email or web address], MD OB Fellow, Faculty [facility] 03/19/2020 12:35 PM

## 2020-03-19 NOTE — Lactation Note (Signed)
This note was copied from a baby's chart. Lactation Consultation Note  Patient Name: Casey Wolfe Date: 03/19/2020  P1, 6 hour ETI female infant. LC entered room mom and infant asleep at this time.    Maternal Data    Feeding Feeding Type: Breast Fed  LATCH Score Latch: Repeated attempts needed to sustain latch, nipple held in mouth throughout feeding, stimulation needed to elicit sucking reflex.  Audible Swallowing: Spontaneous and intermittent  Type of Nipple: Flat  Comfort (Breast/Nipple): Soft / non-tender  Hold (Positioning): Assistance needed to correctly position infant at breast and maintain latch.  LATCH Score: 7  Interventions Interventions: Breast feeding basics reviewed;Assisted with latch;Hand express;Adjust position;Support pillows;Position options  Lactation Tools Discussed/Used     Consult Status      Casey Wolfe 03/19/2020, 1:33 AM

## 2020-03-19 NOTE — Clinical Social Work Maternal (Signed)
CLINICAL SOCIAL WORK MATERNAL/CHILD NOTE  Patient Details  Name: Casey Wolfe MRN: [id number] Date of Birth: 10-04-1995  Date:  03/19/2020  Clinical Social Worker Initiating Note:  Hortencia Pilar, LCSW Date/Time: Initiated:  03/19/20/0915     Child's Name:  Casey Wolfe   Biological Parents:  Mother Casey Wolfe)   Need for Interpreter:  None   Reason for Referral:  Behavioral Health Concerns, Current Substance Use/Substance Use During Pregnancy    Address:  [address] [place] [address]    Phone number:  [phone] (home)     Additional phone number: none reported.   Household Members/Support Persons (HM/SP):   Household Member/Support Person 1   HM/SP Name Relationship DOB or Age  HM/SP -1 Casey Wolfe  MOB   1996-07-07  HM/SP -2        HM/SP -3        HM/SP -4        HM/SP -5        HM/SP -6        HM/SP -7        HM/SP -8          Natural Supports (not living in the home):  Parent, Extended Family   Professional Supports: Paramedic ([facility] in [place].)   Employment: Unemployed   Type of Work: none at this time.   Education:  Attending college   Homebound arranged:  n/a  Financial Resources:  Medicaid   Other Resources:  Sales executive , WIC   Cultural/Religious Considerations Which May Impact Care: none   Strengths:  Ability to meet basic needs , Compliance with medical plan , Psychotropic Medications, Pediatrician chosen   Psychotropic Medications:  Zoloft, Wellbutrin, Lamictal, Buspar      Pediatrician:    [place] (including [place])  Pediatrician List:   [facility] Other ([facility]    Pediatrician Fax Number:    Risk Factors/Current Problems:  Substance Use , None   Cognitive State:  Alert , Insightful , Able to Concentrate    Mood/Affect:  Relaxed ,  Comfortable , Calm , Interested , Happy    CSW Assessment: CSW consulted as MOB has a hx of PTSD, Anxiety,depression, Bipolar and THC use in pregnancy. CSW went to speak with MOB at bedside to address  further needs.   CSW congratulated MOB on the birth of infant. CSW advised MOB of HIPPA policy in which MOB was agreeable to having MGM leave the room. CSW then advised MOB of CSW's role and the reason for CSW coming to visit with. MOB reported that she was diagnosed with PTSD  And Bipolar in 2020. MOB reports that she was diagnosed with anxiety and depression during her teenage years. MOB reports that she is currently on Wellbutrin, Buspar PRN, Zoloft, and Lamictal for her mental health needs. MOB expressed that during her pregnancy she chose to "cut down on the meds" but reports that she will began to take full dosage of medications during the post partum period. MOB reported that she feels that her medication regiment is working well for her. MOB reported no other mental health hx to this CSW and denies SI, HI and DV.   CSW inquired from [facility] on her THC use while pregnant. MOB reported that she was dealing with hyperemesis which caused her to  be hospitalized at least three times a week for fluid. MOB reported that she was unable to eat or sit up and would vomit if doing so. MOB reported that [facility] helped with this therefore she used only as needed. CSW understanding and reported to [facility] the hospital drug screen policy. MOB reported that she understood and expressed that her last use for THC was around 31-32 weeks. MOB dis ask CSW "do you think it will still be in her system since its been more than 30 days". CSW advised MOB that CSW is unsure as sometimes things can linger in system longer. MOB reported that she understood and expressed no current to  previous CPS hx MOB reports that she has her own home but will be staying with her mom for a few weeks in the post partum period. MOB reported that her mom and  other family is her primary support as FOB will not be involved with infant. MOB reports that she has all needed items to care for infant and reported no other needs.   CSW provided MOB with PPD and SIDS education. MOB was  given PPD Checklist to keep track of feelings as they relate to PPD. MOB reported that she has a safe place for infant to sleep once arrived home. CSW will continue  to monitor infants CDS and UDS and make CPS report if warranted. No  barriers to d/c.    CSW Plan/Description:  No Further Intervention Required/No Barriers to Discharge, Perinatal Mood and Anxiety Disorder (PMADs) Education, Sudden Infant Death Syndrome (SIDS) Education, CSW Will Continue to Monitor Umbilical Cord Tissue Drug Screen Results and Make Report if [facility] Drug Screen Policy Information    Loralie Champagne 03/19/2020, 9:44 AM

## 2020-03-19 NOTE — Progress Notes (Signed)
During report the L&D RN told the [facility] RN that the pt delivered infant with foley still in and after delivery they found the foley on the floor/not deflated. Since arriving to the [facility], pt has gotten out of bed twice and experienced incontinence- voiding all over herself and not able to stop voiding when it occurs. Pt stated "when she changes positions in bed or stands up, she feels herself voiding". RN will continue to monitor and report off to oncoming shift.  Herbert Moors, RN

## 2020-03-19 NOTE — Anesthesia Postprocedure Evaluation (Signed)
Anesthesia Post Note  Patient: Casey Wolfe  Procedure(s) Performed: AN AD HOC LABOR EPIDURAL     Patient location during evaluation: Mother Baby Anesthesia Type: Epidural Level of consciousness: awake Pain management: satisfactory to patient Vital Signs Assessment: post-procedure vital signs reviewed and stable Respiratory status: spontaneous breathing Cardiovascular status: stable Anesthetic complications: no   No complications documented.  Last Vitals:  Vitals:   03/19/20 0010 03/19/20 0430  BP: (!) 140/94 120/73  Pulse: 79 80  Resp: 18 17  Temp: 36.5 C 36.8 C  SpO2: 99% 94%    Last Pain:  Vitals:   03/19/20 0527  TempSrc:   PainSc: 0-No pain   Pain Goal:                   [employer]

## 2020-03-19 NOTE — Discharge Instructions (Signed)

## 2020-03-19 NOTE — Lactation Note (Signed)
This note was copied from a baby's chart. Lactation Consultation Note  Patient Name: Casey Wolfe Date: 03/19/2020 Reason for consult: Initial assessment   Mother is a P61, infant is 43 hours old    Mother was given [facility] brochure and basic teaching done.   Mother reports that infant is feeding well. Mother reports that she started leaking colostrum at [redacted]  Weeks pregnant. Mother reports she just finished breastfeeding on one breast and LC observed colostrum in the Nipple  Shield lying on the table.  .   Mother reports that her nipples are flat and that  infant is feeding well with a nipple shield she reports that she is hearing infant swallow and feels strong tugs.  Reviewed hand expression with mother. Observed large drops of colostrum.  Mother placed infant in football position on the alternate breast and then placed the nipple shield on properly. Infant latched on well with little assistance from [facility] .observed infant for 15 mins with strong tugging .   Discussed need to post pump when using a nipple shield.  Mother wants to so pumping and supplementing . Staff nurse Gaylyn Rong to sat up DEBP for patient .   Mother to continue to cue base feed infant and feed at least 8-12 times or more in 24 hours and advised to allow for cluster feeding infant as needed.  Mother to continue to due STS. Mother is aware of available LC services at [facility], BFSG'S, OP Dept, and phone # for questions or concerns about breastfeeding.  Mother receptive to all teaching and plan of care.     Maternal Data Has patient been taught Hand Expression?: Yes Does the patient have breastfeeding experience prior to this delivery?: No  Feeding Feeding Type: Breast Fed  LATCH Score Latch: Grasps breast easily, tongue down, lips flanged, rhythmical sucking.  Audible Swallowing: Spontaneous and intermittent (observed colostrum in the shield)  Type of Nipple: Flat  Comfort (Breast/Nipple): Soft / non-tender  Hold  (Positioning): Assistance needed to correctly position infant at breast and maintain latch.  LATCH Score: 8  Interventions Interventions: Breast feeding basics reviewed;Assisted with latch;Skin to skin;Hand express;Breast compression;Adjust position;Support pillows;Position options  Lactation Tools Discussed/Used Tools: Nipple Shields Nipple shield size: 24   Consult Status Consult Status: Follow-up Date: 03/20/20 Follow-up type: In-patient    Stevan Born [facility] 03/19/2020, 12:59 PM

## 2020-03-20 MED ORDER — NIFEDIPINE ER OSMOTIC RELEASE 30 MG PO TB24: 30.0000 mg | ORAL_TABLET | Freq: Every day | ORAL | 11 refills | Status: DC

## 2020-03-20 MED ORDER — ACETAMINOPHEN 325 MG PO TABS: 650.0000 mg | ORAL_TABLET | Freq: Four times a day (QID) | ORAL | Status: DC | PRN

## 2020-03-20 MED ORDER — FERROUS SULFATE 325 (65 FE) MG PO TABS
325.0000 mg | ORAL_TABLET | ORAL | 3 refills | Status: DC
Start: 2020-03-21 — End: 2023-10-30

## 2020-03-20 MED ORDER — NIFEDIPINE ER OSMOTIC RELEASE 30 MG PO TB24
30.0000 mg | ORAL_TABLET | Freq: Every day | ORAL | Status: DC
  Administered 2020-03-20: 30 mg via ORAL
  Filled 2020-03-20: qty 1

## 2020-03-20 MED ORDER — ASCORBIC ACID 250 MG PO TABS
250.0000 mg | ORAL_TABLET | ORAL | 3 refills | Status: DC
Start: 2020-03-21 — End: 2023-10-30

## 2020-03-20 MED ORDER — IBUPROFEN 600 MG PO TABS
600.0000 mg | ORAL_TABLET | Freq: Four times a day (QID) | ORAL | 0 refills | Status: DC
Start: 2020-03-20 — End: 2023-10-30

## 2020-03-20 MED FILL — FERROUS SULFATE 325 MG TAB: 325 (65 FE) | 60 days supply | Qty: 30 | Fill #0

## 2020-03-20 MED FILL — IBUPROFEN 600 MG TABLET: 600 | 7 days supply | Qty: 30 | Fill #0

## 2020-03-20 MED FILL — VITAMIN C 500 MG TABLET: 500 | 120 days supply | Qty: 30 | Fill #0

## 2020-03-20 MED FILL — NIFEdipine ER 30 MG TB24: 30 | 30 days supply | Qty: 30 | Fill #0

## 2020-03-20 NOTE — Lactation Note (Signed)
This note was copied from a baby's chart. Lactation Consultation Note  Patient Name: Casey Chala Gul [id number] Date: 03/20/2020 Reason for consult: Follow-up assessment   P2 mother with infant that is 37 hours and is at 8 % wt loss this a. M. Mother is attempting to breastfeed without the NS . Mother reports that she feels better about infants latch without the nipple shield.  Observed feeding and infants latch wide and gaped. Observed frequent swallows. Infant was given 10 ml ebm with curved tip syringe at the breast.  Mother request to use a #5 fr feeding tube at the breast. 5 ml of donor milk given on alternate breast.  for a total of 15 ml of supplement.  Mother was given supplemental guidelines and advised to offer infant as much as desired.  Mother to continue to breastfeed and then supplement infant .she was informed of paced bottle feeding with wide based bottle nipple. Mother reports that she has a Mam bottle nipple at home to use. Mother has a Spectra pump at home and plans to continue to pump every 2-3 hours for 15 mins .  Mother to continue to cue base feed infant and feed at least 8-12 times or more in 24 hours and advised to allow for cluster feeding infant as needed.   Mother to continue to due STS. Mother is aware of available LC services at [facility], BFSG'S, OP Dept, and phone # for questions or concerns about breastfeeding.  Mother receptive to all teaching and plan of care.     Maternal Data    Feeding Feeding Type: Donor Breast Milk  LATCH Score Latch: Grasps breast easily, tongue down, lips flanged, rhythmical sucking.  Audible Swallowing: Spontaneous and intermittent  Type of Nipple: Everted at rest and after stimulation  Comfort (Breast/Nipple): Soft / non-tender  Hold (Positioning): Full assist, staff holds infant at breast  LATCH Score: 8  Interventions Interventions: Assisted with latch;Skin to skin;Hand express;Breast compression;Adjust position;Support  pillows;Position options;Hand pump;DEBP  Lactation Tools Discussed/Used Tools: 21F feeding tube / Syringe   Consult Status Consult Status: Follow-up Date: 03/20/20 Follow-up type: In-patient    Stevan Born [facility] 03/20/2020, 9:21 AM

## 2020-03-21 ENCOUNTER — Telehealth: Payer: Self-pay | Admitting: *Deleted

## 2020-03-21 ENCOUNTER — Encounter: Payer: Medicaid Other | Admitting: Obstetrics and Gynecology

## 2020-03-21 NOTE — Telephone Encounter (Signed)
Contacted patient to complete transition of care assessment:  Transition Care Management Follow-up Telephone Call  . Medicaid Managed Care Transition Call Status:MM [facility] Call Made  . Date of discharge and from where:  [facility], 03/20/20  . How have you been since you were released from the hospital? "feeling ok"  . Any questions or concerns? "No"  Items Reviewed: Marland Kitchen Did the pt receive and understand the discharge instructions provided? Yes  . Medications obtained and verified? Yes   . Any new allergies since your discharge? No  . Dietary orders reviewed? No . Do you have support at home? Yes, family  Functional Questionnaire: (I = Independent and D = Dependent)  ADLs: Independent Bathing/Dressing:Independent Meal Prep: Independent Eating: Independent Maintaining continence: Independent Transferring/Ambulation: Independent Managing Meds: Independent   Follow up appointments reviewed:   PCP Hospital f/u appt confirmed? No Patient would like to be assigned a PCP   Specialist Hospital f/u appt confirmed? Yes,  Scheduled to be seen at [facility] on 03/23/20    Are transportation arrangements needed? No   If their condition worsens, is the pt aware to call PCP or go to the EmergencyDept.?  Yes  Was the patient provided with contact information for the PCP's office or ED? Yes  Was to pt encouraged to call back with questions or concerns? Yes  Burnard Bunting, RN, BSN, CCRN Patient Engagement Center [phone]

## 2020-03-23 ENCOUNTER — Telehealth: Payer: Self-pay

## 2020-03-23 ENCOUNTER — Ambulatory Visit: Payer: Medicaid Other

## 2020-03-23 NOTE — Telephone Encounter (Signed)
Pt was scheduled for an in office BP check today. Pt called before appointment to inform us that her sister (whi lives in the same house as her) had been exposed to COVID and was waiting for COVID test results. Pt confirmed that she had a BP cuff at home so I asked pt to take her BP and to call us so we can discuss her BP reading. Pt never called me back with BP reading. I attempted to call pt's phone number on file and there was no answer and voicemail box was full so I could not leave a message. MyChart message also sent to patient.

## 2020-03-26 ENCOUNTER — Ambulatory Visit: Payer: Medicaid Other

## 2020-03-27 ENCOUNTER — Inpatient Hospital Stay (HOSPITAL_COMMUNITY): Admission: RE | Admit: 2020-03-27 | Payer: Medicaid Other | Source: Home / Self Care

## 2020-04-19 ENCOUNTER — Ambulatory Visit: Payer: Medicaid Other | Admitting: Obstetrics & Gynecology

## 2020-04-19 ENCOUNTER — Telehealth: Payer: Self-pay | Admitting: *Deleted

## 2020-04-19 NOTE — Telephone Encounter (Signed)
Not able to leave patient a message to reschedule 4 week Postpartum appointment due to mailbox being full.

## 2020-04-27 ENCOUNTER — Ambulatory Visit: Payer: Medicaid Other | Admitting: Certified Nurse Midwife

## 2020-05-08 ENCOUNTER — Other Ambulatory Visit: Payer: Self-pay

## 2020-05-08 ENCOUNTER — Ambulatory Visit (INDEPENDENT_AMBULATORY_CARE_PROVIDER_SITE_OTHER): Payer: Medicaid Other | Admitting: Advanced Practice Midwife

## 2020-05-08 ENCOUNTER — Encounter: Payer: Self-pay | Admitting: Advanced Practice Midwife

## 2020-05-08 NOTE — Progress Notes (Signed)
Post Partum Visit Note  Casey Wolfe is a 24 y.o. [id number] female who presents for a postpartum visit. She is 7 weeks postpartum following a normal spontaneous vaginal delivery.  I have fully reviewed the prenatal and intrapartum course. The delivery was at [redacted]w[redacted]d gestational weeks.  Anesthesia: epidural. Postpartum course has been unremarkable. Baby is doing well. Baby is feeding by breast. Bleeding no bleeding. Bowel function is normal. Bladder function is normal. Patient is not sexually active. Contraception method is NuvaRing vaginal inserts. Postpartum depression screening: positive.   The pregnancy intention screening data noted above was reviewed. Potential methods of contraception were discussed. The patient elected to proceed with Abstinence.    Edinburgh Postnatal Depression Scale - 05/08/20 1402      Edinburgh Postnatal Depression Scale:  In the Past 7 Days   I have been able to laugh and see the funny side of things. 0    I have looked forward with enjoyment to things. 0    I have blamed myself unnecessarily when things went wrong. 1    I have been anxious or worried for no good reason. 3    I have felt scared or panicky for no good reason. 2    Things have been getting on top of me. 1    I have been so unhappy that I have had difficulty sleeping. 1    I have felt sad or miserable. 1    I have been so unhappy that I have been crying. 1    The thought of harming myself has occurred to me. 1    Edinburgh Postnatal Depression Scale Total 11            The following portions of the patient's history were reviewed and updated as appropriate: allergies, current medications, past family history, past medical history, past social history, past surgical history and problem list.  Review of Systems Pertinent items noted in HPI and remainder of comprehensive ROS otherwise negative.    Objective:  Blood pressure 118/69, pulse (!) 102, resp. rate 16, height 5\' 3"  (1.6 m), weight 225  lb (102.1 kg), last menstrual period 06/21/2019, currently breastfeeding.  VS reviewed, nursing note reviewed,  Constitutional: well developed, well nourished, no distress HEENT: normocephalic CV: normal rate Pulm/chest wall: normal effort Abdomen: soft Neuro: alert and oriented x 3 Skin: warm, dry Psych: affect normal   Assessment:   Normal postpartum exam. Pap smear not done at today's visit.   Plan:   Essential components of care per ACOG recommendations:  1.  Mood and well being: Patient with borderline depression screening today.Pt reports anxiety is unchanged but now she is anxious about the baby and not other things in her life. She has regular counseling and reports this is helping her. She has good support at home.    - Patient does not use tobacco. - hx of drug use? No    2. Infant care and feeding:  -Patient currently breastmilk feeding? Yes  If breastmilk feeding discussed return to work and pumping. If needed, patient was provided letter for work to allow for every 2-3 hr pumping breaks, and to be granted a private location to express breastmilk and refrigerated area to store breastmilk. Reviewed importance of draining breast regularly to support lactation. -Social determinants of health (SDOH) reviewed in EPIC. No concerns  3. Sexuality, contraception and birth spacing - Patient does not want a pregnancy in the next year.  - Reviewed forms of contraception in tiered  fashion. Patient desired lactational amenorrhea and abstinence today.  She is not currently sexually active and reports she will contact the office if she plans to be sexually active and/or if contraception is desired.  - Discussed birth spacing of 18 months  4. Sleep and fatigue -Encouraged family/partner/community support of 4 hrs of uninterrupted sleep to help with mood and fatigue  5. Physical Recovery  - Discussed patients delivery - Patient had a second degree laceration, perineal healing  reviewed. Patient expressed understanding - Patient has urinary incontinence? No  - Patient is safe to resume physical and sexual activity  6.  Health Maintenance - Last pap smear done 2020 and was normal.  7. Chronic Disease - PCP follow up  Sharen Counter, CNM [facility], [facility]

## 2020-06-14 ENCOUNTER — Ambulatory Visit: Payer: Medicaid Other | Attending: Physical Therapy | Admitting: Physical Therapy

## 2020-06-25 ENCOUNTER — Ambulatory Visit: Payer: Medicaid Other | Admitting: Physical Therapy

## 2020-09-07 ENCOUNTER — Ambulatory Visit: Payer: Medicaid Other | Admitting: Physical Therapy

## 2020-10-15 ENCOUNTER — Ambulatory Visit: Payer: Medicaid Other | Attending: Family Medicine | Admitting: Physical Therapy

## 2020-11-03 DIAGNOSIS — R001 Bradycardia, unspecified: Secondary | ICD-10-CM | POA: Diagnosis not present

## 2020-11-03 DIAGNOSIS — F23 Brief psychotic disorder: Secondary | ICD-10-CM | POA: Diagnosis not present

## 2020-11-04 DIAGNOSIS — R001 Bradycardia, unspecified: Secondary | ICD-10-CM | POA: Diagnosis not present

## 2020-11-05 DIAGNOSIS — F23 Brief psychotic disorder: Secondary | ICD-10-CM | POA: Diagnosis not present

## 2020-11-06 DIAGNOSIS — F319 Bipolar disorder, unspecified: Secondary | ICD-10-CM | POA: Diagnosis not present

## 2020-11-07 DIAGNOSIS — F319 Bipolar disorder, unspecified: Secondary | ICD-10-CM | POA: Diagnosis not present

## 2020-11-08 DIAGNOSIS — F319 Bipolar disorder, unspecified: Secondary | ICD-10-CM | POA: Diagnosis not present

## 2020-11-09 DIAGNOSIS — F319 Bipolar disorder, unspecified: Secondary | ICD-10-CM | POA: Diagnosis not present

## 2020-11-10 DIAGNOSIS — F319 Bipolar disorder, unspecified: Secondary | ICD-10-CM | POA: Diagnosis not present

## 2020-11-11 DIAGNOSIS — F319 Bipolar disorder, unspecified: Secondary | ICD-10-CM | POA: Diagnosis not present

## 2020-11-12 DIAGNOSIS — F319 Bipolar disorder, unspecified: Secondary | ICD-10-CM | POA: Diagnosis not present

## 2020-11-13 DIAGNOSIS — F319 Bipolar disorder, unspecified: Secondary | ICD-10-CM | POA: Diagnosis not present

## 2020-11-14 DIAGNOSIS — F319 Bipolar disorder, unspecified: Secondary | ICD-10-CM | POA: Diagnosis not present

## 2020-11-15 DIAGNOSIS — F319 Bipolar disorder, unspecified: Secondary | ICD-10-CM | POA: Diagnosis not present

## 2020-11-28 DIAGNOSIS — F250 Schizoaffective disorder, bipolar type: Secondary | ICD-10-CM | POA: Diagnosis not present

## 2020-11-30 DIAGNOSIS — F23 Brief psychotic disorder: Secondary | ICD-10-CM | POA: Diagnosis not present

## 2020-11-30 DIAGNOSIS — R451 Restlessness and agitation: Secondary | ICD-10-CM | POA: Diagnosis not present

## 2020-12-11 ENCOUNTER — Telehealth: Payer: Self-pay

## 2020-12-11 NOTE — Telephone Encounter (Signed)
Transition Care Management Unsuccessful Follow-up Telephone Call  Date of discharge and from where:  12/10/2020 from [facility]  Attempts:  1st Attempt  Reason for unsuccessful TCM follow-up call:  Unable to leave message

## 2020-12-12 NOTE — Telephone Encounter (Signed)
Transition Care Management Unsuccessful Follow-up Telephone Call  Date of discharge and from where:  12/10/2020 from [facility]  Attempts:  2nd Attempt  Reason for unsuccessful TCM follow-up call:  Unable to reach patient

## 2020-12-13 NOTE — Telephone Encounter (Signed)
Transition Care Management Unsuccessful Follow-up Telephone Call  Date of discharge and from where:  12/12/2020 - [facility]  Attempts:  3rd Attempt  Reason for unsuccessful TCM follow-up call:  Unable to reach patient

## 2020-12-28 DIAGNOSIS — F250 Schizoaffective disorder, bipolar type: Secondary | ICD-10-CM | POA: Diagnosis not present

## 2021-01-22 DIAGNOSIS — Z20822 Contact with and (suspected) exposure to covid-19: Secondary | ICD-10-CM | POA: Diagnosis not present

## 2021-01-22 DIAGNOSIS — F23 Brief psychotic disorder: Secondary | ICD-10-CM | POA: Diagnosis not present

## 2021-01-28 DIAGNOSIS — F3164 Bipolar disorder, current episode mixed, severe, with psychotic features: Secondary | ICD-10-CM | POA: Diagnosis not present

## 2021-01-28 DIAGNOSIS — F4310 Post-traumatic stress disorder, unspecified: Secondary | ICD-10-CM | POA: Diagnosis not present

## 2021-01-29 DIAGNOSIS — F4310 Post-traumatic stress disorder, unspecified: Secondary | ICD-10-CM | POA: Diagnosis not present

## 2021-01-29 DIAGNOSIS — F3164 Bipolar disorder, current episode mixed, severe, with psychotic features: Secondary | ICD-10-CM | POA: Diagnosis not present

## 2021-01-30 DIAGNOSIS — F3164 Bipolar disorder, current episode mixed, severe, with psychotic features: Secondary | ICD-10-CM | POA: Diagnosis not present

## 2021-01-30 DIAGNOSIS — F4310 Post-traumatic stress disorder, unspecified: Secondary | ICD-10-CM | POA: Diagnosis not present

## 2021-04-04 ENCOUNTER — Telehealth: Payer: Self-pay | Admitting: *Deleted

## 2021-04-04 NOTE — Telephone Encounter (Signed)
Not able to leave patient a message due to voicemail being full to schedule annual after 05/08/2021.

## 2021-06-18 IMAGING — US US MFM OB DETAIL+14 WK
1 series · 13 of 28 positions shown · non-contrast
Comparison: none

[Series 1: us mfm ob detail+14 wk · 13 of 153 slices shown]
[im 6/153]
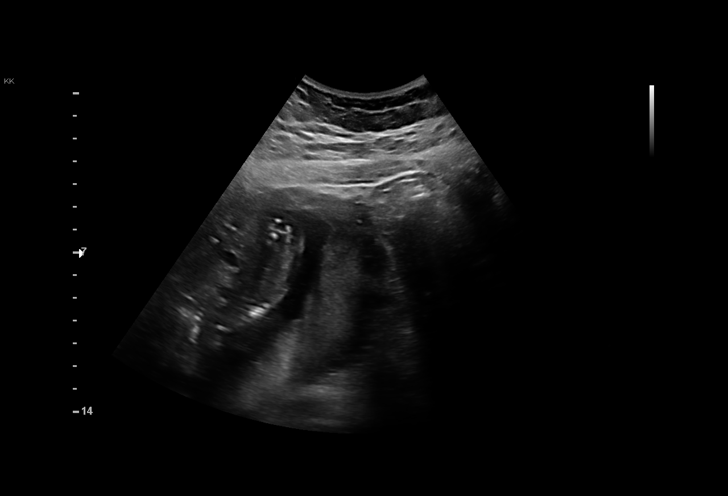
[im 17/153]
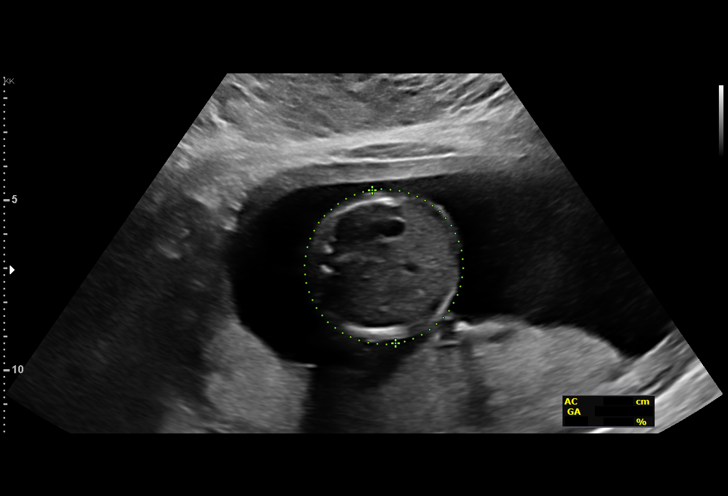
[im 29/153]
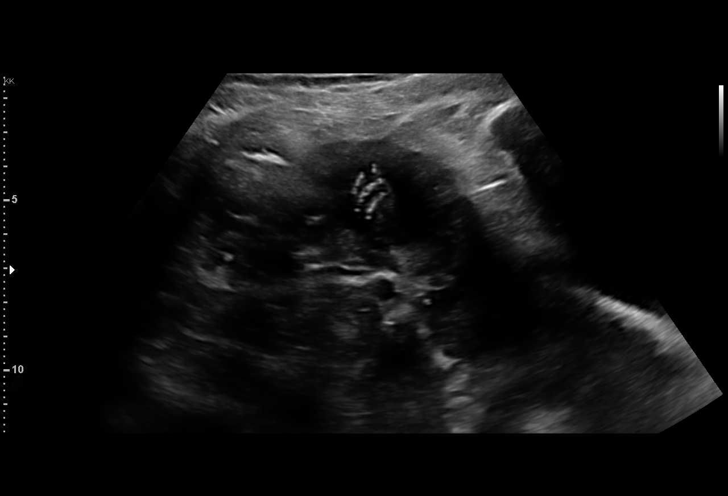
[im 40/153]
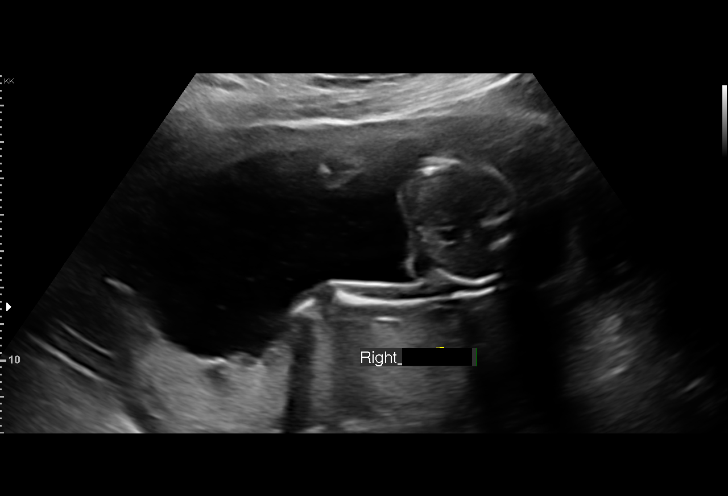
[im 51/153]
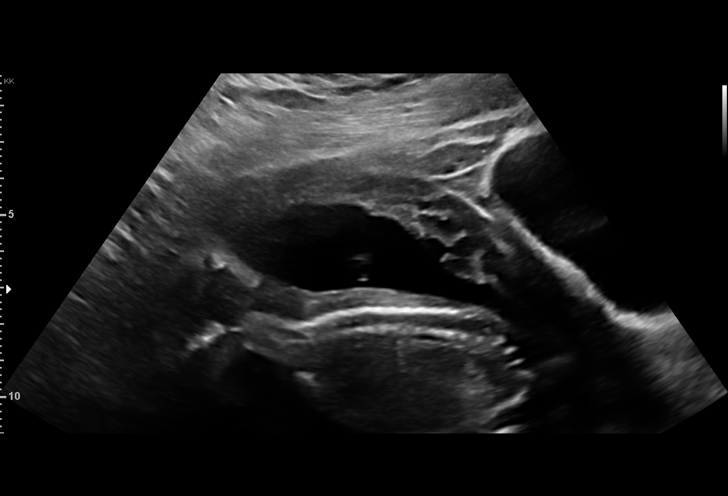
[im 62/153]
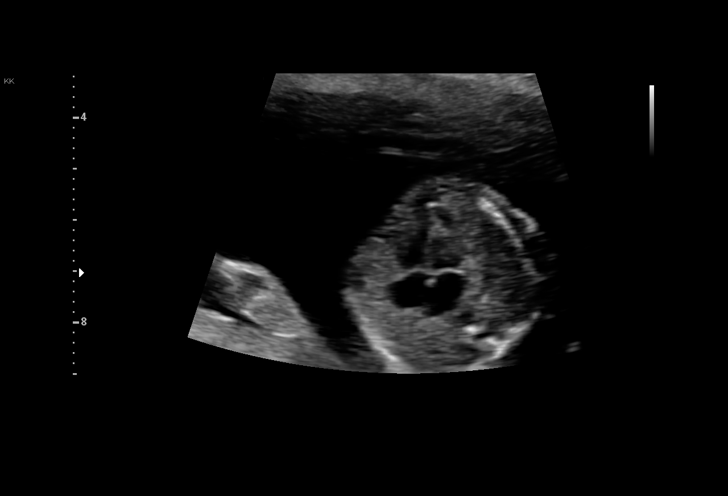
[im 79/153]
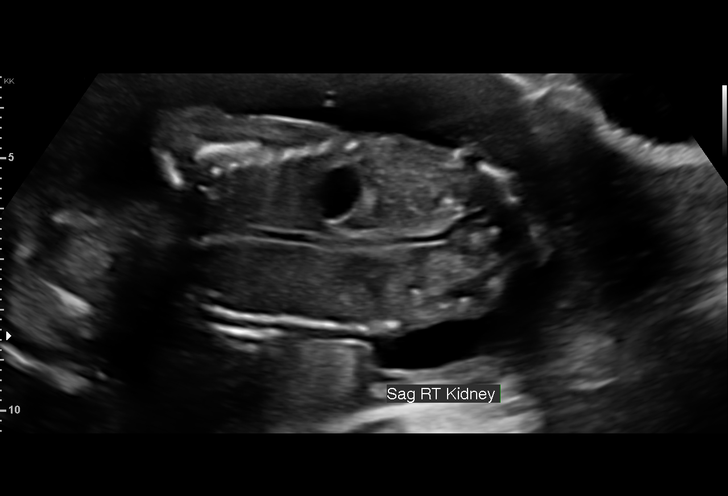
[im 91/153]
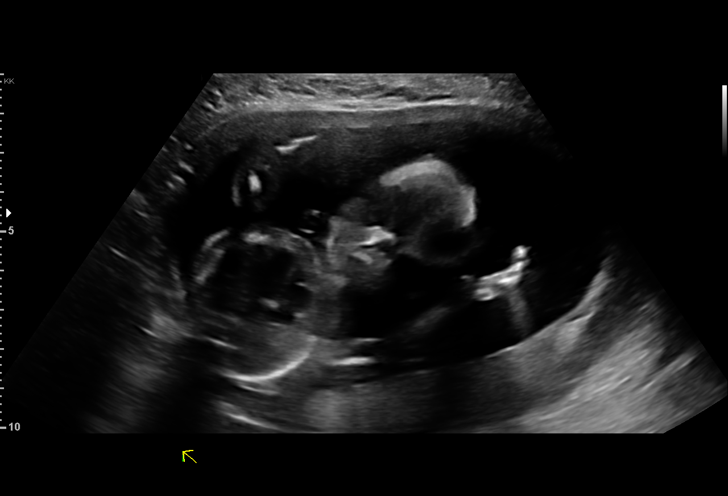
[im 102/153]
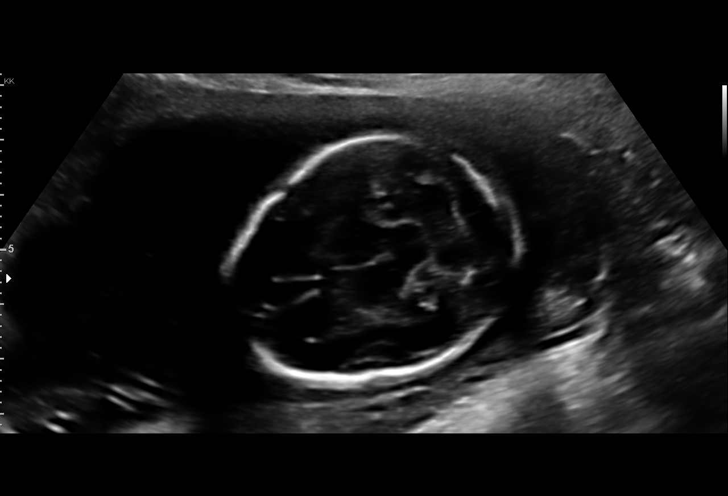
[im 113/153]
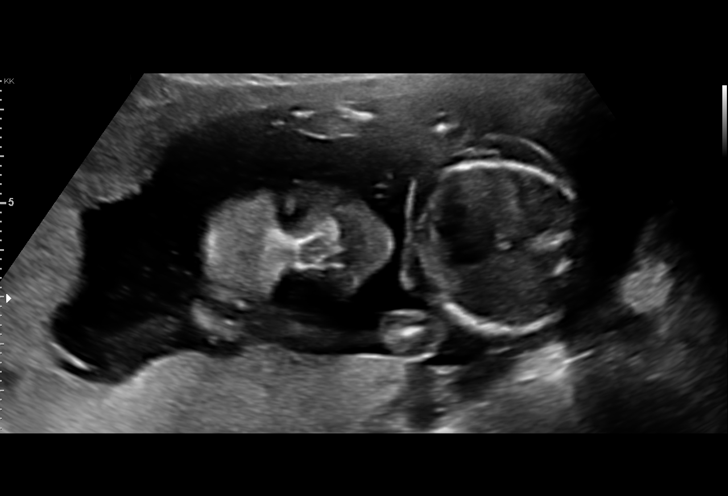
[im 124/153]
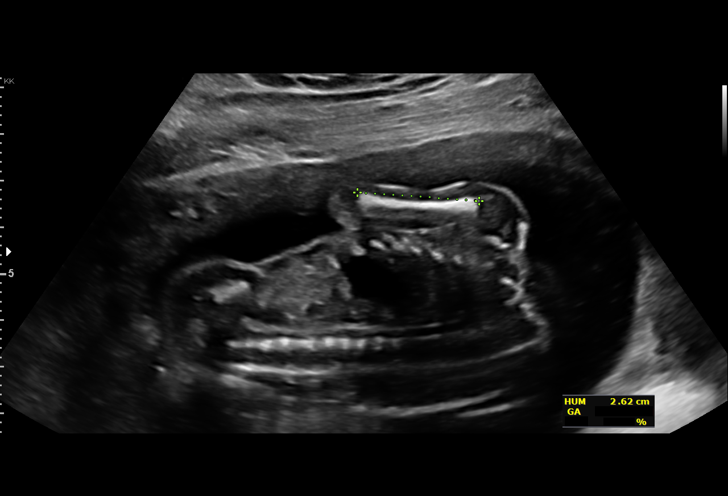
[im 136/153]
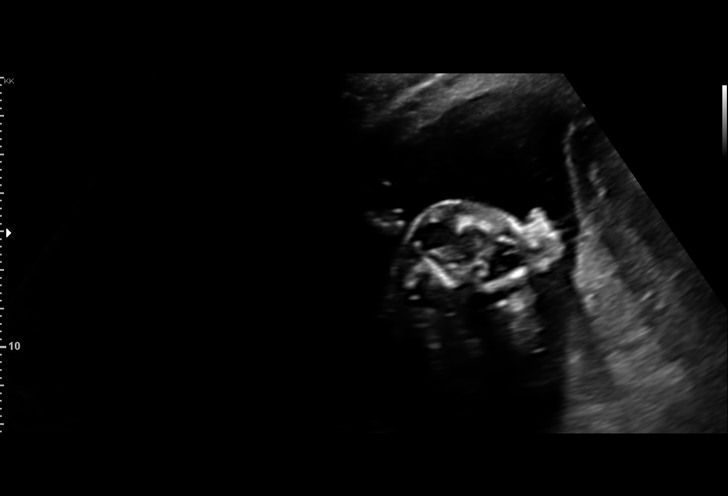
[im 147/153]
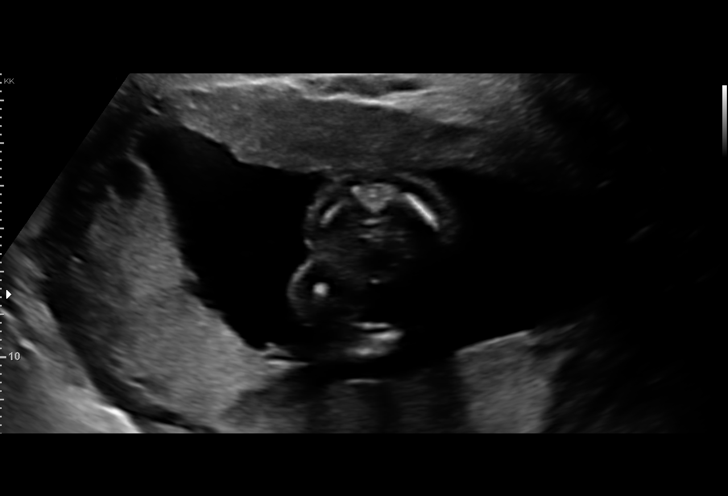

[13 of 28 positions shown; findings below may reference images not displayed]

----------------------------------------------------------------------

 ----------------------------------------------------------------------
Indications

  Encounter for antenatal screening for
  malformations
  Obesity complicating pregnancy, second
  trimester
  Tobacco use complicating pregnancy,
  second trimester
  Other mental disorder complicating
  pregnancy, second trimester
  19 weeks gestation of pregnancy
 ----------------------------------------------------------------------
Vital Signs

 BMI:
Fetal Evaluation

 Num Of Fetuses:         1
 Fetal Heart Rate(bpm):  157
 Cardiac Activity:       Observed
 Presentation:           Variable
 Placenta:               Posterior
 P. Cord Insertion:      Not well visualized

 Amniotic Fluid
 AFI FV:      Within normal limits

                             Largest Pocket(cm)

Biometry

 BPD:      42.9  mm     G. Age:  19w 0d         43  %    CI:        71.77   %    70 - 86
                                                         FL/HC:      17.1   %    16.1 -
 HC:      161.2  mm     G. Age:  18w 6d         32  %    HC/AC:      1.13        1.09 -
 AC:      142.3  mm     G. Age:  19w 4d         61  %    FL/BPD:     64.3   %
 FL:       27.6  mm     G. Age:  18w 3d         19  %    FL/AC:      19.4   %    20 - 24
 HUM:      26.2  mm     G. Age:  18w 1d         27  %
 NFT:         2  mm

 Est. FW:     270  gm    0 lb 10 oz      39  %
OB History

 Gravidity:    3         Term:   0        Prem:   0        SAB:   2
 TOP:          0       Ectopic:  0        Living: 0
Gestational Age

 LMP:           19w 1d        Date:  06/21/19                 EDD:   03/27/20
 U/S Today:     19w 0d                                        EDD:   03/28/20
 Best:          19w 1d     Det. By:  LMP  (06/21/19)          EDD:   03/27/20
Anatomy

 Cranium:               Appears normal         LVOT:                   Appears normal
 Cavum:                 Appears normal         Aortic Arch:            Appears normal
 Ventricles:            Appears normal         Ductal Arch:            Not well visualized
 Choroid Plexus:        Appears normal         Diaphragm:              Appears normal
 Cerebellum:            Appears normal         Stomach:                Appears normal, left
                                                                       sided
 Posterior Fossa:       Appears normal         Abdomen:                Appears normal
 Nuchal Fold:           Appears normal         Abdominal Wall:         Appears nml (cord
                                                                       insert, abd wall)
 Face:                  Appears normal         Cord Vessels:           Appears normal (3
                        (orbits and profile)                           vessel cord)
 Lips:                  Appears normal         Kidneys:                Appear normal
 Palate:                Not well visualized    Bladder:                Appears normal
 Thoracic:              Appears normal         Spine:                  Appears normal
 Heart:                 Appears normal         Upper Extremities:      Appears normal
                        (4CH, axis, and
                        situs)
 RVOT:                  Not well visualized    Lower Extremities:      Appears normal

 Other:  Heels and 5th digit visualized. Technically difficult due to maternal
         habitus and fetal position.
Cervix Uterus Adnexa

 Cervix
 Length:            3.4  cm.
 Normal appearance by transabdominal scan.

 Adnexa
 No abnormality visualized.
Impression

 Normal anatomy, however, suboptimal views of the fetal
 anatomy was obtained secondary to fetal position.
 Good fetal movement and amniotic fluid
 History of tobacco.
 Dating is consistent with today's ultrasound.
Recommendations

 Follow up growth in 4 weeks to complete the fetal anatomy.

## 2021-09-17 IMAGING — US US MFM OB FOLLOW-UP
1 series · 14 of 28 positions shown · non-contrast
Comparison: none

[Series 1: us mfm ob follow-up · 28 acquisitions, 14 frames shown]
[im 2/28]
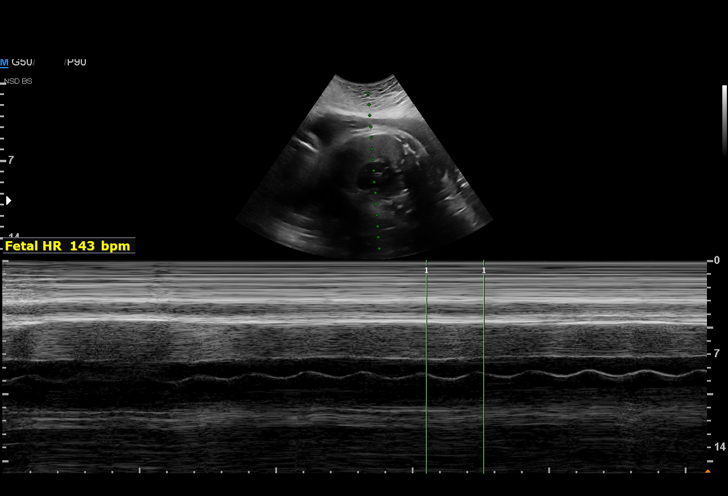
[im 4/28]
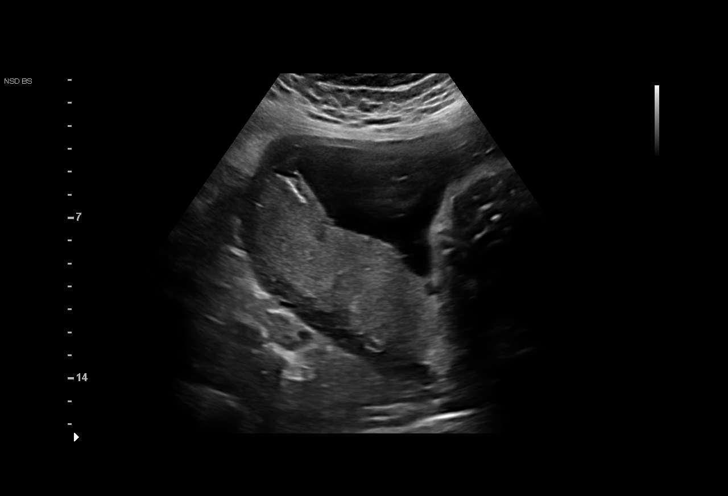
[im 6/28]
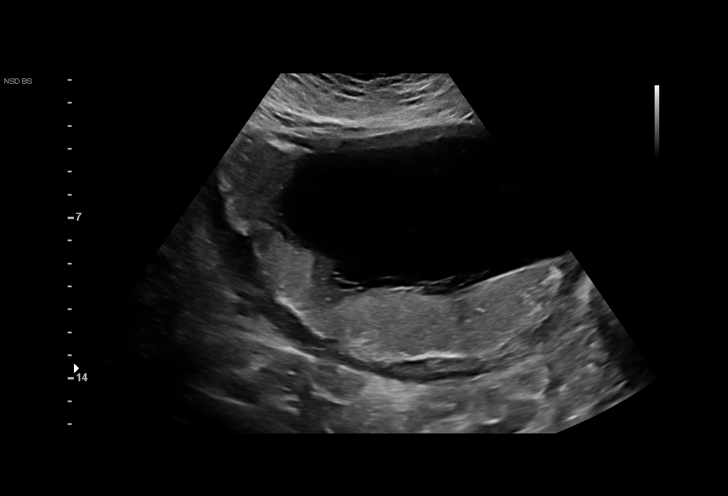
[im 8/28]
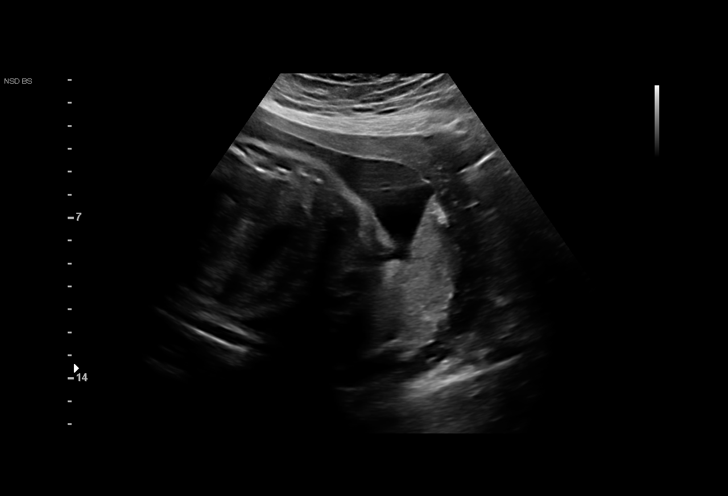
[im 10/28]
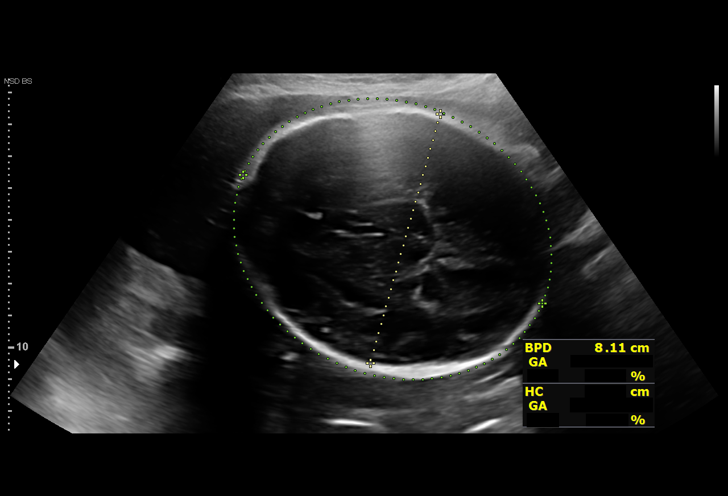
[im 12/28]
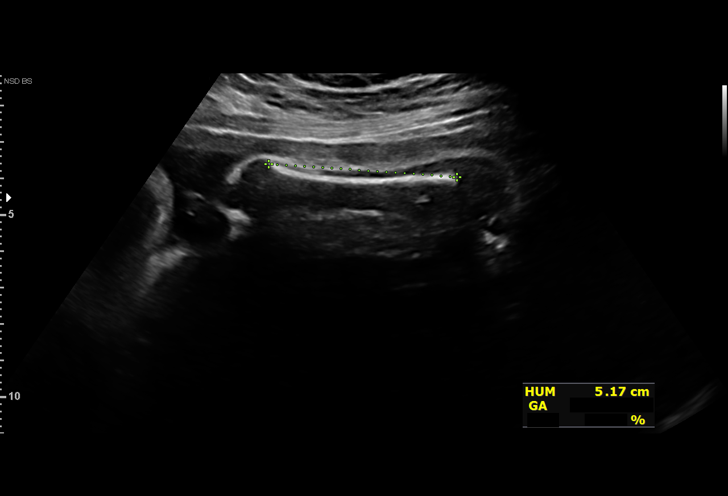
[im 14/28]
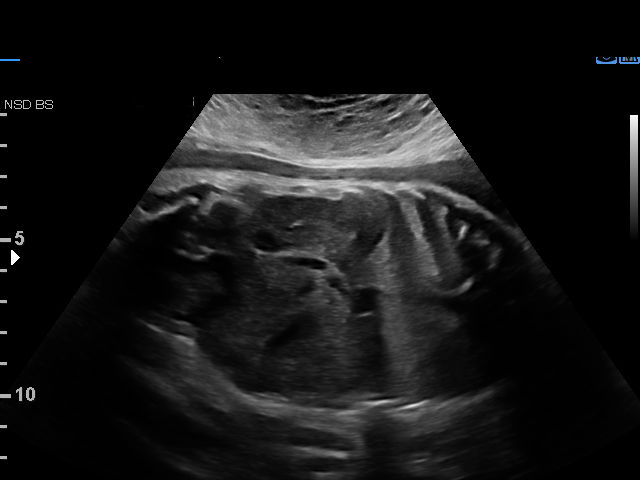
[im 16/28]
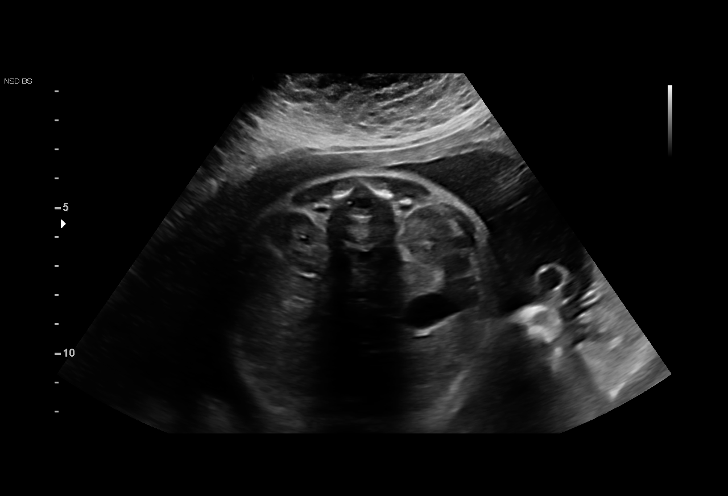
[im 18/28]
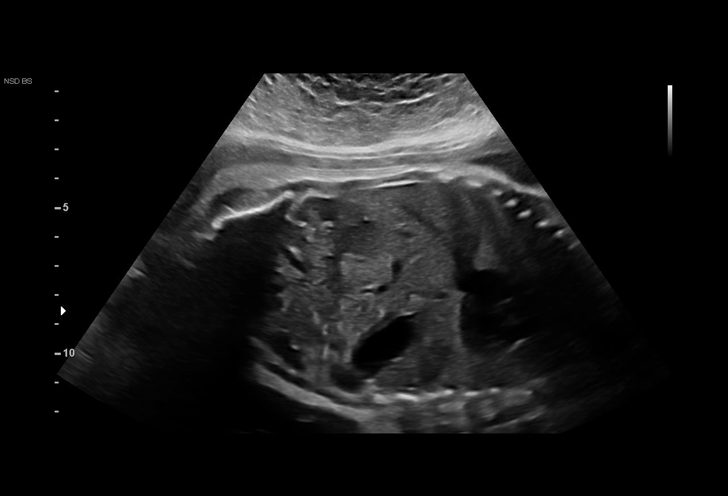
[im 20/28]
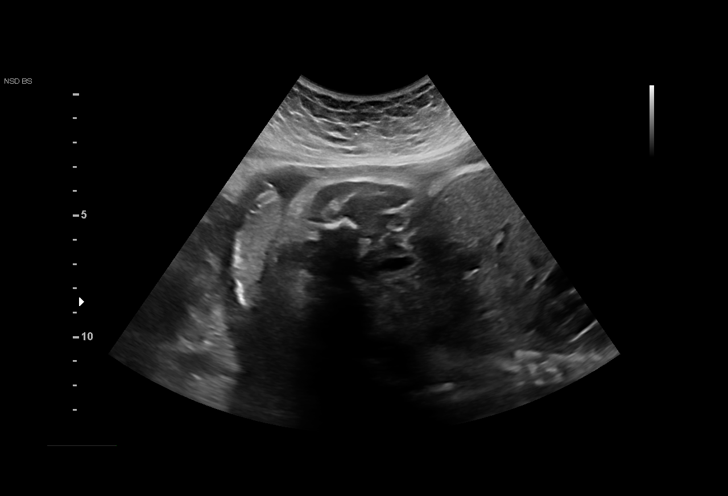
[im 22/28]
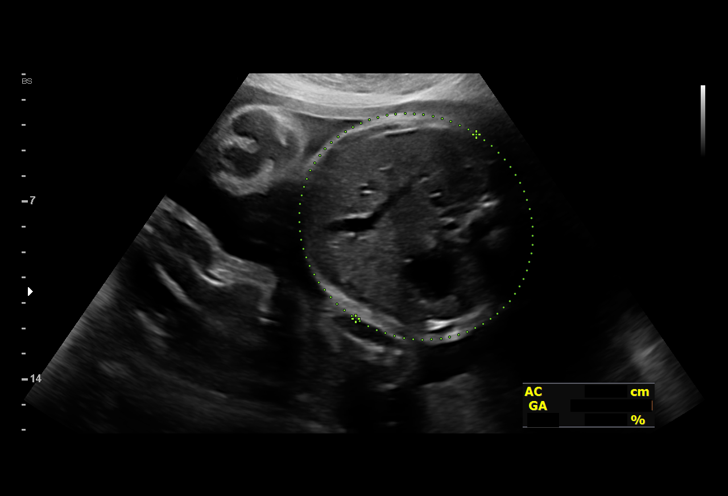
[im 24/28]
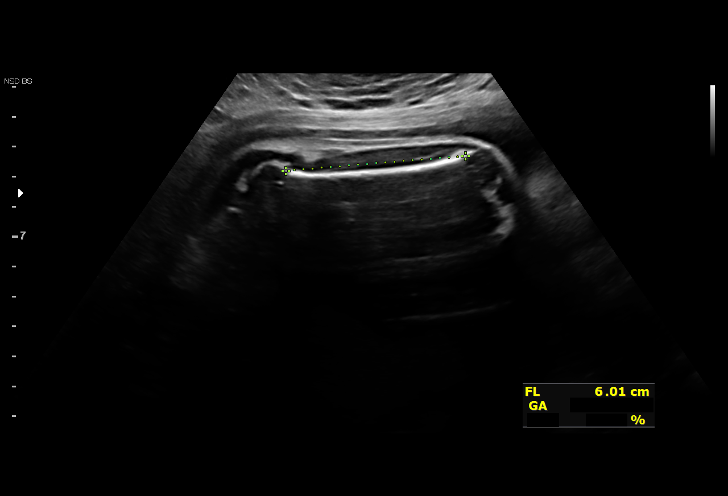
[im 26/28]
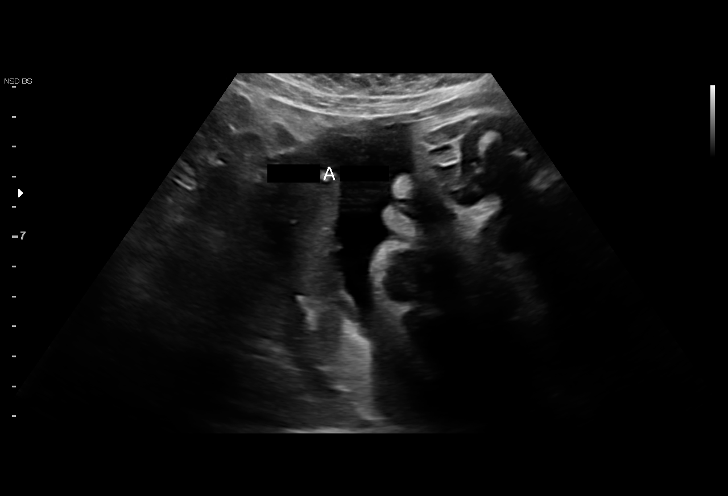
[im 28/28]
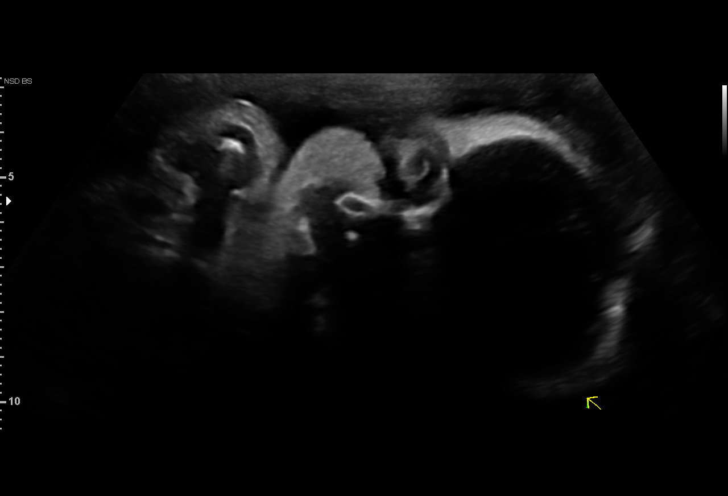

[14 of 28 positions shown; findings below may reference images not displayed]

FERIENHAUS

Indications

 Obesity complicating pregnancy, second
 trimester
 32 weeks gestation of pregnancy
 Tobacco use complicating pregnancy,
 second trimester
 Other mental disorder complicating
 pregnancy, second trimester
Vital Signs

                                                Height:        5'3"
Fetal Evaluation

 Num Of Fetuses:         1
 Fetal Heart Rate(bpm):  143
 Cardiac Activity:       Observed
 Presentation:           Cephalic
 Placenta:               Posterior
 P. Cord Insertion:      Previously Visualized

 Amniotic Fluid
 AFI FV:      Within normal limits

 AFI Sum(cm)     %Tile       Largest Pocket(cm)
 22.2            86          7.

 RUQ(cm)       RLQ(cm)       LUQ(cm)        LLQ(cm)
 7
Biometry

 BPD:      81.4  mm     G. Age:  32w 5d         59  %    CI:        75.93   %    70 - 86
                                                         FL/HC:      20.2   %    19.1 -
 HC:      296.1  mm     G. Age:  32w 5d         28  %    HC/AC:      1.05        0.96 -
 AC:      281.3  mm     G. Age:  32w 1d         50  %    FL/BPD:     73.6   %    71 - 87
 FL:       59.9  mm     G. Age:  31w 1d         15  %    FL/AC:      21.3   %    20 - 24
 HUM:      51.6  mm     G. Age:  30w 1d         10  %

 LV:        3.6  mm

 Est. FW:    9805  gm      4 lb 2 oz     34  %
OB History

 Gravidity:    3         Term:   0        Prem:   0        SAB:   2
 TOP:          0       Ectopic:  0        Living: 0
Gestational Age

 LMP:           32w 1d        Date:  06/21/19                 EDD:   03/27/20
 U/S Today:     32w 1d                                        EDD:   03/27/20
 Best:          32w 1d     Det. By:  LMP  (06/21/19)          EDD:   03/27/20
Anatomy

 Cranium:               Appears normal         Aortic Arch:            Previously seen
 Cavum:                 Previously seen        Ductal Arch:            Previously seen
 Ventricles:            Appears normal         Diaphragm:              Appears normal
 Choroid Plexus:        Previously seen        Stomach:                Appears normal, left
                                                                       sided
 Cerebellum:            Previously seen        Abdomen:                Appears normal
 Posterior Fossa:       Previously seen        Abdominal Wall:         Previously seen
 Nuchal Fold:           Previously seen        Cord Vessels:           Previously seen
 Face:                  Orbits and profile     Kidneys:                Appear normal
                        previously seen
 Lips:                  Previously seen        Bladder:                Appears normal
 Thoracic:              Appears normal         Spine:                  Previously seen
 Heart:                 Previously seen        Upper Extremities:      Previously seen
 RVOT:                  Previously seen        Lower Extremities:      Previously seen
 LVOT:                  Previously seen

 Other:  Heels and 5th digit prev visualized. Technically difficult due to
         maternal habitus and fetal position.
Cervix Uterus Adnexa

 Cervix
 Not visualized (advanced GA >39wks)
Impression

 Follow up growth due to elevated maternal BMI
 Normal interval growth with good fetal movement and
 amniotic fluid volume.
Recommendations

 Follow up as clinically indicated.

## 2023-10-28 ENCOUNTER — Other Ambulatory Visit (HOSPITAL_COMMUNITY)
Admission: RE | Admit: 2023-10-28 | Discharge: 2023-10-28 | Disposition: A | Discharge status: Home / Self Care | Payer: MEDICAID | Source: Ambulatory Visit | Attending: Obstetrics and Gynecology | Admitting: Obstetrics and Gynecology

## 2023-10-28 ENCOUNTER — Ambulatory Visit: Payer: MEDICAID | Admitting: Obstetrics and Gynecology

## 2023-10-28 ENCOUNTER — Encounter: Payer: Self-pay | Admitting: Obstetrics and Gynecology

## 2023-10-28 VITALS — BP 137/80 | HR 81 | Ht 63.0 in | Wt 242.0 lb

## 2023-10-28 DIAGNOSIS — N911 Secondary amenorrhea: Secondary | ICD-10-CM

## 2023-10-28 DIAGNOSIS — Z124 Encounter for screening for malignant neoplasm of cervix: Secondary | ICD-10-CM | POA: Diagnosis present

## 2023-10-28 DIAGNOSIS — Z3202 Encounter for pregnancy test, result negative: Secondary | ICD-10-CM | POA: Diagnosis not present

## 2023-10-28 DIAGNOSIS — N912 Amenorrhea, unspecified: Secondary | ICD-10-CM

## 2023-10-28 LAB — POCT URINE PREGNANCY: Preg Test, Ur: NEGATIVE

## 2023-10-28 NOTE — Progress Notes (Unsigned)
 Last known period October 2020. Pt stopped breastfeeding April 2023 and hasn't had a period

## 2023-10-30 ENCOUNTER — Encounter: Payer: Self-pay | Admitting: Obstetrics and Gynecology

## 2023-10-30 LAB — CYTOLOGY - PAP
Adequacy: ABSENT
Diagnosis: NEGATIVE

## 2023-10-30 NOTE — Progress Notes (Signed)
   NEW GYNECOLOGY VISIT  Subjective:  Casey Wolfe is a 28 y.o. [id number] with LMP 04/28/2019 presenting for secondary amenorrhea  Has not had a period since the birth of her daughter in 2021. Had normal postpartum lochia. Her postpartum course was c/b postpartum psychosis and she has a diagnosis of bipolar 1 disorder. She was started on injectable invega trinza (paliperidone) during that time and continues on it q3 months. She breastfed for 2.5 years. She notes she still has milky nipple discharge. No hot flashes, vaginal dryness, acne, or hair growth. Had a vaginal delivery, no history of uterine procedures like D&C/D&E. No issues with oligomenorrhea prior to pregnancy. She is interested in trying to conceive another child.   Objective:   Vitals:   10/28/23 1509  BP: 137/80  Pulse: 81  Weight: 242 lb (109.8 kg)  Height: 5\' 3"  (1.6 m)   General:  Alert, oriented and cooperative. Patient is in no acute distress.  Skin: Skin is warm and dry. No rash noted.   Cardiovascular: Normal heart rate noted  Respiratory: Normal respiratory effort, no problems with respiration noted  Abdomen: Soft, non-tender, non-distended   Pelvic: NEFG. Normal cervix. Uterus, adnexa non palpable due to habitus; no tenderness or mass  Exam performed in the presence of a chaperone  Assessment and Plan:  Casey Wolfe is a 28 y.o. with secondary amenorrhea  1. Secondary amenorrhea (Primary) Suspect hyperprolactinemia related to paliperidone. Will obtain labs to confirm & r/o other possible endocrine etiologies Discussed implications for conception if she is not ovulating, but will need to further discuss management options if this is the etiology of her amenorrhea - POCT urine pregnancy - CBC - Comp Met (CMET) - FSH - Estradiol - Prolactin - TSH Rfx on Abnormal to Free T4  2. Screening for cervical cancer - Cytology - PAP( [facility])  Return in about 2 weeks (around 11/11/2023) for virtual lab follow  up.  Future Appointments  Date Time Provider Department Center  11/11/2023  2:50 PM Lennart Pall, MD [facility]   Lennart Pall, MD

## 2023-10-31 LAB — CBC
Hematocrit: 41.9 % (ref 34.0–46.6)
Hemoglobin: 13.9 g/dL (ref 11.1–15.9)
MCH: 28.4 pg (ref 26.6–33.0)
MCHC: 33.2 g/dL (ref 31.5–35.7)
MCV: 86 fL (ref 79–97)
Platelets: 339 10*3/uL (ref 150–450)
RBC: 4.89 x10E6/uL (ref 3.77–5.28)
RDW: 13 % (ref 11.7–15.4)
WBC: 11 10*3/uL — ABNORMAL HIGH (ref 3.4–10.8)

## 2023-10-31 LAB — COMPREHENSIVE METABOLIC PANEL WITH GFR
ALT: 23 IU/L (ref 0–32)
AST: 36 IU/L (ref 0–40)
Albumin: 4.3 g/dL (ref 4.0–5.0)
Alkaline Phosphatase: 85 IU/L (ref 44–121)
BUN/Creatinine Ratio: 16 (ref 9–23)
BUN: 13 mg/dL (ref 6–20)
Bilirubin Total: 0.5 mg/dL (ref 0.0–1.2)
CO2: 24 mmol/L (ref 20–29)
Calcium: 10.3 mg/dL — ABNORMAL HIGH (ref 8.7–10.2)
Chloride: 103 mmol/L (ref 96–106)
Creatinine, Ser: 0.8 mg/dL (ref 0.57–1.00)
Globulin, Total: 2.7 g/dL (ref 1.5–4.5)
Glucose: 69 mg/dL — ABNORMAL LOW (ref 70–99)
Potassium: 4.6 mmol/L (ref 3.5–5.2)
Sodium: 142 mmol/L (ref 134–144)
Total Protein: 7 g/dL (ref 6.0–8.5)
eGFR: 103 mL/min/{1.73_m2} (ref >59)

## 2023-10-31 LAB — PROLACTIN: Prolactin: 37.8 ng/mL — ABNORMAL HIGH (ref 4.8–33.4)

## 2023-10-31 LAB — ESTRADIOL: Estradiol: 23.4 pg/mL

## 2023-10-31 LAB — FOLLICLE STIMULATING HORMONE: FSH: 5.1 m[IU]/mL

## 2023-10-31 LAB — TSH RFX ON ABNORMAL TO FREE T4: TSH: 1.65 u[IU]/mL (ref 0.450–4.500)

## 2023-11-11 ENCOUNTER — Telehealth: Payer: MEDICAID | Admitting: Obstetrics and Gynecology

## 2023-11-11 DIAGNOSIS — E221 Hyperprolactinemia: Secondary | ICD-10-CM | POA: Diagnosis not present

## 2023-11-11 NOTE — Progress Notes (Signed)
 TELEHEALTH GYNECOLOGY VISIT ENCOUNTER NOTE  Provider location: [facility] [place]   Patient location: Home  I connected with Casey Wolfe on 11/11/23 at  2:50 PM EDT by MyChart audiovisual encounter  History:  Casey Wolfe is a 28 y.o. [id number] following up for secondary amenorrhea  Saw pt 4/2 - see note for details. In brief, no period in 4 years in s/o exclusive breastfeeding and being on paliperidone for postpartum psychosis/bipolar 1 disorder. Paliperidone is high risk for hyperprolactinemia. She has symptoms of hyperPRL like persistent milky nipple discharge even after stopping breast feeding.   Not actively TTC or planning any time soon, but wants to make sure they're no permanent damage and get cycles back   I personally reviewed the following from 10/28/23 - CBC 11>13.9<339 - CMP unremarkable - FSH 5.1 (low normal) - Estradiol  23.4 (low/low normal) - Prolactin 37.8 (high) - TSH 1.65 (normal) - UPT negative  Presents today to review labs and discuss next steps    Past Medical History:  Diagnosis Date   Anxiety    Bipolar 1 disorder (HCC)    Depression    Hyperemesis affecting pregnancy, antepartum    Past Surgical History:  Procedure Laterality Date   RHINOPLASTY     The following portions of the patient's history were reviewed and updated as appropriate: allergies, current medications, past family history, past medical history, past social history, past surgical history and problem list.   Review of Systems:  Pertinent items noted in HPI and remainder of comprehensive ROS otherwise negative.  Physical Exam:   General:  Alert, oriented and cooperative.   Mental Status: Normal mood and affect perceived. Normal judgment and thought content.  Physical exam deferred due to nature of the encounter  Labs and Imaging Results for orders placed or performed in visit on 10/28/23 (from the past 2 weeks)  POCT urine pregnancy   Collection Time:  10/28/23  3:22 PM  Result Value Ref Range   Preg Test, Ur Negative Negative  Cytology - PAP( [facility])   Collection Time: 10/28/23  3:47 PM  Result Value Ref Range   Adequacy      Satisfactory for evaluation; transformation zone component ABSENT.   Diagnosis      - Negative for intraepithelial lesion or malignancy (NILM)  CBC   Collection Time: 10/28/23  3:55 PM  Result Value Ref Range   WBC 11.0 (H) 3.4 - 10.8 x10E3/uL   RBC 4.89 3.77 - 5.28 x10E6/uL   Hemoglobin 13.9 11.1 - 15.9 g/dL   Hematocrit 45.4 09.8 - 46.6 %   MCV 86 79 - 97 fL   MCH 28.4 26.6 - 33.0 pg   MCHC 33.2 31.5 - 35.7 g/dL   RDW 11.9 14.7 - 82.9 %   Platelets 339 150 - 450 x10E3/uL  Comp Met (CMET)   Collection Time: 10/28/23  3:55 PM  Result Value Ref Range   Glucose 69 (L) 70 - 99 mg/dL   BUN 13 6 - 20 mg/dL   Creatinine, Ser 5.62 0.57 - 1.00 mg/dL   eGFR 130 >86 VH/QIO/9.62   BUN/Creatinine Ratio 16 9 - 23   Sodium 142 134 - 144 mmol/L   Potassium 4.6 3.5 - 5.2 mmol/L   Chloride 103 96 - 106 mmol/L   CO2 24 20 - 29 mmol/L   Calcium 10.3 (H) 8.7 - 10.2 mg/dL   Total Protein 7.0 6.0 - 8.5 g/dL   Albumin 4.3 4.0 - 5.0 g/dL  Globulin, Total 2.7 1.5 - 4.5 g/dL   Bilirubin Total 0.5 0.0 - 1.2 mg/dL   Alkaline Phosphatase 85 44 - 121 IU/L   AST 36 0 - 40 IU/L   ALT 23 0 - 32 IU/L  [facility]   Collection Time: 10/28/23  3:55 PM  Result Value Ref Range   FSH 5.1 mIU/mL  Estradiol    Collection Time: 10/28/23  3:55 PM  Result Value Ref Range   Estradiol  23.4 pg/mL  Prolactin   Collection Time: 10/28/23  3:55 PM  Result Value Ref Range   Prolactin 37.8 (H) 4.8 - 33.4 ng/mL  TSH Rfx on Abnormal to Free T4   Collection Time: 10/28/23  3:55 PM  Result Value Ref Range   TSH 1.650 0.450 - 4.500 uIU/mL   No results found.    Assessment and Plan:   27yo with hyperprolactinemia  - Suspect this is related to paliperidone. Discussed that paliperidone is a critical medication for her mental health, so  making adjustments needs to be done in close collaboration with her psychiatrist.  - Repeat fasting AM PRL in May to confirm diagnosis - Management options include changing medications to one that does not elevate prolactin to such an extent, adding abilify, adding dopamine agonist. Emphasized need for close collaboration with psych. Will sign ROI and she has upcoming appointment with psychiatrist - Condoms for contraception - May add OCP or HRT for bone health if planning to continue current regimen and persistent amenorrhea  Follow up: 2-3 weeks for fasting AM prolactin, follow up conversation with psychiatrist  I discussed the assessment and treatment plan with the patient. The patient was provided an opportunity to ask questions and all were answered. The patient agreed with the plan and demonstrated an understanding of the instructions.   The patient was advised to call back or seek an in-person evaluation/go to the ED if the symptoms worsen or if the condition fails to improve as anticipated.  I provided 21 minutes of non-face-to-face time during this encounter.   Izell Marsh, MD [facility], [facility]

## 2023-12-01 ENCOUNTER — Ambulatory Visit: Payer: MEDICAID

## 2023-12-01 DIAGNOSIS — R7989 Other specified abnormal findings of blood chemistry: Secondary | ICD-10-CM

## 2023-12-01 NOTE — Progress Notes (Addendum)
 Result sent to ordering provider.  Rasch, Juliette Oh, NP   Patient presents for repeat Prolactin lab. Patient was sent to the lab to have lab drawn. Marvette Schamp l Nellene Courtois, CMA

## 2023-12-02 LAB — PROLACTIN: Prolactin: 54.7 ng/mL — ABNORMAL HIGH (ref 4.8–33.4)

## 2023-12-07 ENCOUNTER — Encounter: Payer: Self-pay | Admitting: Obstetrics and Gynecology

## 2024-02-18 ENCOUNTER — Encounter: Payer: Self-pay | Admitting: Obstetrics and Gynecology

## 2024-02-18 ENCOUNTER — Ambulatory Visit: Payer: MEDICAID | Admitting: Obstetrics and Gynecology

## 2024-02-18 VITALS — Ht 63.0 in | Wt 250.0 lb

## 2024-02-18 DIAGNOSIS — E221 Hyperprolactinemia: Secondary | ICD-10-CM

## 2024-02-18 DIAGNOSIS — N911 Secondary amenorrhea: Secondary | ICD-10-CM

## 2024-02-18 NOTE — Progress Notes (Signed)
   RETURN GYNECOLOGY VISIT  Subjective:  Kamyra Schroeck is a 28 y.o. [phone] presenting for follow up of secondary amenorrhea due to hyperprolactinemia/medication use  Previously on injectable paliperidone (invega trinza, 3 month dosing) for postpartum psychosis/bipolar 1 disorder. PRL 37.8 with repeat 54.7. She was amenorrheic since being on this medication and has galactorrhea. She is interested in conceiving. She discussed her medications with her psychiatrist and switched to Latuda. Last paliperidone injection was around April. She continues to have galactorrhea and amenorrhea. She is using condoms for contraception.   Objective:   Vitals:   02/18/24 1103  Weight: 250 lb (113.4 kg)  Height: 5' 3 (1.6 m)   General:  Alert, oriented and cooperative. Patient is in no acute distress.  Skin: Skin is warm and dry. No rash noted.   Cardiovascular: Normal heart rate noted  Respiratory: Normal respiratory effort, no problems with respiration noted   Assessment and Plan:  Savi Lastinger is a 28 y.o. with  Secondary amenorrhea Hyperprolactinemia (HCC) Half life of Invega Trinaza ranges from 84-139 days. She is ~90 days from her last dose Reviewed that it will take time for medication to metabolize, PRL levels to drop, and for menstrual cycle to resume Will give it another 3 months, then follow up If persistent amenorrhea/galactorrhea, will repeat prolactin level and consider eval for prolactinoma though this is unlikely  Will continue to use contraception (condoms) in the meantime PNV  Return in about 3 months (around 05/20/2024) for follow up periods.  Kieth JAYSON Carolin, MD

## 2024-02-22 ENCOUNTER — Encounter: Payer: Self-pay | Admitting: Obstetrics and Gynecology

## 2024-02-22 DIAGNOSIS — N393 Stress incontinence (female) (male): Secondary | ICD-10-CM

## 2024-04-19 ENCOUNTER — Ambulatory Visit: Payer: MEDICAID | Attending: Obstetrics and Gynecology

## 2024-04-19 ENCOUNTER — Telehealth: Payer: Self-pay

## 2024-04-19 NOTE — Telephone Encounter (Signed)
 Called and left message for patient after no-show appointment for pelvic floor physical therapy evlauation.

## 2024-04-19 NOTE — Therapy (Deleted)
 OUTPATIENT PHYSICAL THERAPY FEMALE PELVIC EVALUATION   Patient Name: Casey Wolfe MRN: [id number] DOB:1995/11/07, 28 y.o., female Today's Date: 04/19/2024  END OF SESSION:   Past Medical History:  Diagnosis Date   Anxiety    Bipolar 1 disorder (HCC)    Depression    Hyperemesis affecting pregnancy, antepartum    Past Surgical History:  Procedure Laterality Date   RHINOPLASTY     Patient Active Problem List   Diagnosis Date Noted   Group B Streptococcus carrier, +RV culture, currently pregnant 03/04/2020   Tobacco use disorder 02/03/2019   Methamphetamine use disorder, severe, in sustained remission (HCC) 02/01/2019   MDD (major depressive disorder), recurrent severe, without psychosis (HCC) 02/01/2019   Borderline personality disorder (HCC) 02/01/2019   Adjustment disorder with mixed anxiety and depressed mood 02/01/2019   ADHD (attention deficit hyperactivity disorder), combined type 02/01/2019   Moderate cannabis use disorder (HCC) 02/01/2019   PTSD (post-traumatic stress disorder) 02/01/2019   Scoliosis 04/09/2015    PCP: NA  REFERRING PROVIDER: Erik Kieth BROCKS, MD   REFERRING DIAG: N39.3 (ICD-10-CM) - Urinary, incontinence, stress female  THERAPY DIAG:  No diagnosis found.  Rationale for Evaluation and Treatment: Rehabilitation  ONSET DATE: ***  SUBJECTIVE:                                                                                                                                                                                           SUBJECTIVE STATEMENT: ***   PAIN:  Are you having pain? {yes/no:20286} NPRS scale: ***/10 Pain location: {pelvic pain location:27098}  Pain type: {type:313116} Pain description: {PAIN DESCRIPTION:21022940}   Aggravating factors: *** Relieving factors: ***  PRECAUTIONS: {Therapy precautions:24002}  RED FLAGS: {PT Red Flags:29287}   WEIGHT BEARING RESTRICTIONS: {Yes ***/No:24003}  FALLS:  Has patient  fallen in last 6 months? {fallsyesno:27318}  OCCUPATION: ***  ACTIVITY LEVEL : ***  PLOF: {PLOF:24004}  PATIENT GOALS: ***  PERTINENT HISTORY:  *** Sexual abuse: {Yes/No:304960894}  BOWEL MOVEMENT: Pain with bowel movement: {yes/no:20286} Type of bowel movement:{PT BM type:27100} Fully empty rectum: {No/Yes:304960894} Leakage: {Yes/No:304960894} Pads: {Yes/No:304960894} Fiber supplement/laxative {YES/NO AS:20300}  URINATION: Pain with urination: {yes/no:20286} Fully empty bladder: {Yes/No:304960894} Stream: {PT urination:27102} Urgency: {YES/NO AS:20300} Frequency: *** Fluid Intake: *** Leakage: {PT leakage:27103} Pads: {Yes/No:304960894}  INTERCOURSE:  Ability to have vaginal penetration {YES/NO:21197} Pain with intercourse: {pain with intercourse PA:27099} Dryness{YES/NO AS:20300} Climax: *** Marinoff Scale: ***/3 Lubricant:  PREGNANCY: Vaginal deliveries *** Tearing {Yes***/No:304960894} Episiotomy {YES/NO AS:20300} C-section deliveries *** Currently pregnant {Yes***/No:304960894}  PROLAPSE: {PT prolapse:27101}   OBJECTIVE:  Note: Objective measures were completed at Evaluation unless otherwise noted.  04/19/24 PATIENT SURVEYS:  PFIQ-7: ***  COGNITION: Overall cognitive status: Within functional limits for tasks assessed     SENSATION: Light touch: Appears intact   FUNCTIONAL TESTS:  Squat: Single leg stance:  Rt:  Lt: Curl-up test:   GAIT: Assistive device utilized: {Assistive devices:23999} Comments: ***  POSTURE: {posture:25561}   LUMBARAROM/PROM:  A/PROM A/PROM  Eval (% available)  Flexion   Extension   Right lateral flexion   Left lateral flexion   Right rotation   Left rotation    (Blank rows = not tested)  PALPATION:   General: ***  Pelvic Alignment: ***  Abdominal: ***                External Perineal Exam: ***                             Internal Pelvic Floor: ***  Patient confirms identification and  approves PT to assess internal pelvic floor and treatment {yes/no:20286}  PELVIC MMT:   MMT eval  Vaginal   Internal Anal Sphincter   External Anal Sphincter   Puborectalis   Diastasis Recti   (Blank rows = not tested)        TONE: ***  PROLAPSE: ***  TODAY'S TREATMENT:                                                                                                                              DATE:  *** EVAL  Manual:  Neuromuscular re-education:  Exercises:  Therapeutic activities:     PATIENT EDUCATION:  Education details: See above Person educated: Patient Education method: Explanation, Demonstration, Tactile cues, Verbal cues, and Handouts Education comprehension: verbalized understanding  HOME EXERCISE PROGRAM: ***  ASSESSMENT:  CLINICAL IMPRESSION: Patient is a *** y.o. *** who was seen today for physical therapy evaluation and treatment for ***.   OBJECTIVE IMPAIRMENTS: decreased activity tolerance, decreased coordination, decreased endurance, decreased mobility, decreased ROM, decreased strength, increased fascial restrictions, increased muscle spasms, impaired flexibility, impaired tone, improper body mechanics, postural dysfunction, and pain.   ACTIVITY LIMITATIONS: {activitylimitations:27494}  PARTICIPATION LIMITATIONS: {participationrestrictions:25113}  PERSONAL FACTORS: {Personal factors:25162} are also affecting patient's functional outcome.   REHAB POTENTIAL: {rehabpotential:25112}  CLINICAL DECISION MAKING: {clinical decision making:25114}  EVALUATION COMPLEXITY: {Evaluation complexity:25115}   GOALS: Goals reviewed with patient? {yes/no:20286}  SHORT TERM GOALS: Target date: ***  Pt will be independent with HEP in order to improve activity tolerance.   Baseline: Goal status: INITIAL  2.  ***  Baseline:  Goal status: INITIAL  3.  ***  Baseline:  Goal status: INITIAL  4.  *** Baseline:  Goal status: INITIAL  5.   *** Baseline:  Goal status: INITIAL  6.  *** Baseline:  Goal status: INITIAL  LONG TERM GOALS: Target date: ***  Pt will be independent with advanced HEP in order to improve activity tolerance.   Baseline:  Goal status: INITIAL  2.  *** Baseline:  Goal status: INITIAL  3.  *** Baseline:  Goal status: INITIAL  4.  *** Baseline:  Goal status: INITIAL  5.  *** Baseline:  Goal status: INITIAL  6.  *** Baseline:  Goal status: INITIAL  PLAN:  PT FREQUENCY: 1-2x/week  PT DURATION: ***   PLANNED INTERVENTIONS: 02835- PT Re-evaluation, 97110-Therapeutic exercises, 97530- Therapeutic activity, 97112- Neuromuscular re-education, 97535- Self Care, 02859- Manual therapy, [id number]- Gait training, [phone]- Aquatic Therapy, [phone]- Electrical stimulation (unattended), [id number]- Traction (mechanical), [id number]- Ionotophoresis 4mg /ml Dexamethasone, 79439 (1-2 muscles), 20561 (3+ muscles)- Dry Needling, Patient/Family education, Balance training, Taping, Joint mobilization, Joint manipulation, Spinal manipulation, Spinal mobilization, Scar mobilization, Vestibular training, Cryotherapy, Moist heat, and Biofeedback  PLAN FOR NEXT SESSION: ***   Josette Mares, PT, DPT09/23/257:54 AM

## 2024-05-23 ENCOUNTER — Ambulatory Visit: Payer: MEDICAID | Admitting: Obstetrics and Gynecology

## 2024-06-29 ENCOUNTER — Ambulatory Visit: Payer: MEDICAID | Admitting: Obstetrics and Gynecology

## 2024-06-29 VITALS — BP 101/69 | HR 103 | Wt 244.0 lb

## 2024-06-29 DIAGNOSIS — Z113 Encounter for screening for infections with a predominantly sexual mode of transmission: Secondary | ICD-10-CM | POA: Diagnosis not present

## 2024-06-29 DIAGNOSIS — N911 Secondary amenorrhea: Secondary | ICD-10-CM | POA: Diagnosis not present

## 2024-06-29 MED ORDER — MEDROXYPROGESTERONE ACETATE 10 MG PO TABS: 10.0000 mg | ORAL_TABLET | Freq: Every day | ORAL | 0 refills | Status: AC

## 2024-06-29 NOTE — Progress Notes (Signed)
   RETURN GYNECOLOGY VISIT  Subjective:  Basha Krygier is a 28 y.o. [phone] with secondary amenorrhea presenting for follow up  Initially started care for patient ~ 8 months ago. She was amenorrheic on invega trinza (paliperidone) and was interested in conceiving. She was also dealing with persistent galactorrhea. Serum labs confirmed hyperprolactinemia with normal TSH, FSH/E2.   She has since worked with her psychiatrist to switch to Latuda. Last paliperidone injection was in April, but she continues to have amenorrhea and galactorrhea. Half life of the medication is 139 days which would have been in August.  She has established care with a new PCP who was initially concerned for PCOS. The patient however does not have any clinical signs of hyperandrogenism and her periods prior to being on paliperidone were normal. Digna is currently taking compounded zepbound, metformin, and spironolactone to manage her weight.   Requests serum STI testing.  Objective:   Vitals:   06/29/24 1423  BP: 101/69  Pulse: (!) 103  Weight: 244 lb (110.7 kg)   General:  Alert, oriented and cooperative. Patient is in no acute distress.  Skin: Skin is warm and dry. No rash noted.   Cardiovascular: Mild tachycardia noted  Respiratory: Normal respiratory effort, no problems with respiration noted   Assessment and Plan:  Jolane Bankhead is a 27 y.o. with   1. Secondary amenorrhea (Primary) Repeat labs AM fasting - plans to come in 2 days Further work up pending lab results Will do provera challenge to r/o uterine etiology. If no withdrawal bleed, discussed E+P challenge Discussed cessation of zepbound & spironolactone prior to TTC Reviewed referral to endocrinology or REI will be considered pending lab results - CBC; Future - TSH Rfx on Abnormal to Free T4; Future - FSH; Future - Prolactin; Future - Estradiol ; Future - Testosterone,Free and Total; Future - LH; Future -     medroxyPROGESTERone (PROVERA) 10 MG  tablet; Take 1 tablet (10 mg total) by mouth daily. Use for ten days  2. Routine screening for STI (sexually transmitted infection) - RPR+HBsAg+HCVAb+...  Follow up pending lab results/provera challenge  Kieth JAYSON Carolin, MD

## 2024-07-03 LAB — FOLLICLE STIMULATING HORMONE: FSH: 5 m[IU]/mL

## 2024-07-03 LAB — CBC
Hematocrit: 41.7 % (ref 34.0–46.6)
Hemoglobin: 14.1 g/dL (ref 11.1–15.9)
MCH: 29.1 pg (ref 26.6–33.0)
MCHC: 33.8 g/dL (ref 31.5–35.7)
MCV: 86 fL (ref 79–97)
Platelets: 348 x10E3/uL (ref 150–450)
RBC: 4.85 x10E6/uL (ref 3.77–5.28)
RDW: 12.4 % (ref 11.7–15.4)
WBC: 11.6 x10E3/uL — ABNORMAL HIGH (ref 3.4–10.8)

## 2024-07-03 LAB — TESTOSTERONE,FREE AND TOTAL
Testosterone, Free: 3.2 pg/mL (ref 0.0–4.2)
Testosterone: 47 ng/dL (ref 13–71)

## 2024-07-03 LAB — RPR+HBSAG+HCVAB+...
HIV Screen 4th Generation wRfx: NONREACTIVE
Hep C Virus Ab: NONREACTIVE
Hepatitis B Surface Ag: NEGATIVE
RPR Ser Ql: NONREACTIVE

## 2024-07-03 LAB — LUTEINIZING HORMONE: LH: 9.6 m[IU]/mL

## 2024-07-03 LAB — ESTRADIOL: Estradiol: 33.8 pg/mL

## 2024-07-03 LAB — PROLACTIN: Prolactin: 50.6 ng/mL — ABNORMAL HIGH (ref 4.8–33.4)

## 2024-07-03 LAB — TSH RFX ON ABNORMAL TO FREE T4: TSH: 1.16 u[IU]/mL (ref 0.450–4.500)

## 2024-07-04 ENCOUNTER — Ambulatory Visit: Payer: Self-pay | Admitting: Obstetrics and Gynecology

## 2024-07-04 DIAGNOSIS — E221 Hyperprolactinemia: Secondary | ICD-10-CM

## 2024-12-26 ENCOUNTER — Ambulatory Visit: Payer: MEDICAID | Admitting: "Endocrinology
